# Patient Record
Sex: Male | Born: 1965
Health system: Southern US, Community
[De-identification: ages and names within clinical notes are randomized; demographics above are authoritative.]

## PROBLEM LIST (undated history)

## (undated) DIAGNOSIS — M810 Age-related osteoporosis without current pathological fracture: Secondary | ICD-10-CM

## (undated) DIAGNOSIS — F419 Anxiety disorder, unspecified: Secondary | ICD-10-CM

## (undated) DIAGNOSIS — T7840XA Allergy, unspecified, initial encounter: Secondary | ICD-10-CM

## (undated) DIAGNOSIS — G5603 Carpal tunnel syndrome, bilateral upper limbs: Secondary | ICD-10-CM

## (undated) DIAGNOSIS — R569 Unspecified convulsions: Secondary | ICD-10-CM

## (undated) DIAGNOSIS — M1990 Unspecified osteoarthritis, unspecified site: Secondary | ICD-10-CM

## (undated) DIAGNOSIS — R51 Headache: Secondary | ICD-10-CM

## (undated) DIAGNOSIS — K219 Gastro-esophageal reflux disease without esophagitis: Secondary | ICD-10-CM

## (undated) DIAGNOSIS — I10 Essential (primary) hypertension: Secondary | ICD-10-CM

## (undated) HISTORY — DX: Anxiety disorder, unspecified: F41.9

## (undated) HISTORY — DX: Essential (primary) hypertension: I10

## (undated) HISTORY — PX: ABCESS DRAINAGE: SHX399

## (undated) HISTORY — DX: Unspecified osteoarthritis, unspecified site: M19.90

## (undated) HISTORY — PX: INGUINAL HERNIA REPAIR: SHX194

## (undated) HISTORY — DX: Age-related osteoporosis without current pathological fracture: M81.0

## (undated) HISTORY — DX: Gastro-esophageal reflux disease without esophagitis: K21.9

## (undated) HISTORY — PX: TONSILLECTOMY: SUR1361

## (undated) HISTORY — DX: Allergy, unspecified, initial encounter: T78.40XA

## (undated) HISTORY — DX: Unspecified convulsions: R56.9

## (undated) HISTORY — DX: Headache: R51

## (undated) HISTORY — DX: Carpal tunnel syndrome, bilateral upper limbs: G56.03

---

## 2004-11-05 ENCOUNTER — Emergency Department: Payer: Self-pay | Admitting: Emergency Medicine

## 2004-11-08 ENCOUNTER — Inpatient Hospital Stay: Payer: Self-pay | Admitting: General Practice

## 2005-01-15 ENCOUNTER — Ambulatory Visit: Payer: Self-pay | Admitting: Internal Medicine

## 2005-03-25 ENCOUNTER — Emergency Department: Payer: Self-pay | Admitting: Unknown Physician Specialty

## 2005-06-04 ENCOUNTER — Ambulatory Visit: Payer: Self-pay | Admitting: Internal Medicine

## 2005-11-26 ENCOUNTER — Ambulatory Visit: Payer: Self-pay | Admitting: Internal Medicine

## 2006-03-25 ENCOUNTER — Ambulatory Visit: Payer: Self-pay | Admitting: Internal Medicine

## 2006-04-23 ENCOUNTER — Ambulatory Visit: Payer: Self-pay | Admitting: Internal Medicine

## 2006-07-22 ENCOUNTER — Ambulatory Visit: Payer: Self-pay | Admitting: Internal Medicine

## 2006-10-15 ENCOUNTER — Ambulatory Visit: Payer: Self-pay | Admitting: Internal Medicine

## 2007-02-08 ENCOUNTER — Ambulatory Visit: Payer: Self-pay | Admitting: Family Medicine

## 2007-02-24 ENCOUNTER — Ambulatory Visit: Payer: Self-pay | Admitting: Internal Medicine

## 2007-04-27 ENCOUNTER — Ambulatory Visit: Payer: Self-pay | Admitting: Internal Medicine

## 2007-04-27 LAB — CONVERTED CEMR LAB
Amphetamine Screen, Ur: NEGATIVE
BUN: 8 mg/dL (ref 6–23)
Barbiturate Quant, Ur: NEGATIVE
Benzodiazepines.: POSITIVE — AB
Creatinine, Ser: 0.7 mg/dL (ref 0.4–1.5)
Creatinine,U: 8.9 mg/dL
Direct LDL: 144.5 mg/dL
GFR calc Af Amer: 159 mL/min
GFR calc non Af Amer: 131 mL/min
HDL: 34.6 mg/dL — ABNORMAL LOW (ref >39.0)
Methadone: NEGATIVE
Potassium: 4.2 meq/L (ref 3.5–5.1)
Propoxyphene: NEGATIVE
Total CHOL/HDL Ratio: 6
VLDL: 33 mg/dL (ref 0–40)

## 2007-07-19 ENCOUNTER — Encounter: Payer: Self-pay | Admitting: Internal Medicine

## 2007-07-19 ENCOUNTER — Ambulatory Visit: Payer: Self-pay | Admitting: Internal Medicine

## 2007-07-19 DIAGNOSIS — F419 Anxiety disorder, unspecified: Secondary | ICD-10-CM | POA: Insufficient documentation

## 2007-07-19 DIAGNOSIS — I10 Essential (primary) hypertension: Secondary | ICD-10-CM

## 2007-07-19 DIAGNOSIS — R51 Headache: Secondary | ICD-10-CM

## 2007-07-19 DIAGNOSIS — M1990 Unspecified osteoarthritis, unspecified site: Secondary | ICD-10-CM | POA: Insufficient documentation

## 2007-07-19 DIAGNOSIS — K219 Gastro-esophageal reflux disease without esophagitis: Secondary | ICD-10-CM

## 2007-07-19 DIAGNOSIS — R519 Headache, unspecified: Secondary | ICD-10-CM | POA: Insufficient documentation

## 2007-10-11 ENCOUNTER — Ambulatory Visit: Payer: Self-pay | Admitting: Internal Medicine

## 2007-10-11 ENCOUNTER — Telehealth: Payer: Self-pay | Admitting: Internal Medicine

## 2007-10-11 DIAGNOSIS — J069 Acute upper respiratory infection, unspecified: Secondary | ICD-10-CM | POA: Insufficient documentation

## 2007-10-12 ENCOUNTER — Encounter: Payer: Self-pay | Admitting: Internal Medicine

## 2007-12-03 ENCOUNTER — Telehealth: Payer: Self-pay | Admitting: Internal Medicine

## 2008-01-03 ENCOUNTER — Telehealth (INDEPENDENT_AMBULATORY_CARE_PROVIDER_SITE_OTHER): Payer: Self-pay | Admitting: *Deleted

## 2008-01-24 ENCOUNTER — Telehealth: Payer: Self-pay | Admitting: Internal Medicine

## 2008-02-23 ENCOUNTER — Ambulatory Visit: Payer: Self-pay | Admitting: Internal Medicine

## 2008-02-23 DIAGNOSIS — J309 Allergic rhinitis, unspecified: Secondary | ICD-10-CM | POA: Insufficient documentation

## 2008-02-25 ENCOUNTER — Telehealth: Payer: Self-pay | Admitting: Internal Medicine

## 2008-04-25 ENCOUNTER — Telehealth (INDEPENDENT_AMBULATORY_CARE_PROVIDER_SITE_OTHER): Payer: Self-pay | Admitting: *Deleted

## 2008-04-26 ENCOUNTER — Ambulatory Visit: Payer: Self-pay | Admitting: Internal Medicine

## 2008-04-26 DIAGNOSIS — N3000 Acute cystitis without hematuria: Secondary | ICD-10-CM

## 2008-04-26 LAB — CONVERTED CEMR LAB
Bilirubin Urine: NEGATIVE
Hemoglobin, Urine: NEGATIVE
Leukocytes, UA: NEGATIVE
Nitrite: NEGATIVE
Urobilinogen, UA: 0.2 (ref 0.0–1.0)

## 2008-05-01 ENCOUNTER — Telehealth: Payer: Self-pay | Admitting: Internal Medicine

## 2008-08-17 ENCOUNTER — Ambulatory Visit: Payer: Self-pay | Admitting: Internal Medicine

## 2008-08-20 ENCOUNTER — Encounter: Payer: Self-pay | Admitting: Internal Medicine

## 2008-09-28 ENCOUNTER — Telehealth: Payer: Self-pay | Admitting: Internal Medicine

## 2008-11-13 ENCOUNTER — Telehealth: Payer: Self-pay | Admitting: Internal Medicine

## 2008-11-21 ENCOUNTER — Ambulatory Visit: Payer: Self-pay | Admitting: Internal Medicine

## 2008-11-21 DIAGNOSIS — G8929 Other chronic pain: Secondary | ICD-10-CM

## 2008-11-21 DIAGNOSIS — M549 Dorsalgia, unspecified: Secondary | ICD-10-CM

## 2008-11-22 ENCOUNTER — Telehealth: Payer: Self-pay | Admitting: Internal Medicine

## 2009-01-02 ENCOUNTER — Telehealth: Payer: Self-pay | Admitting: Internal Medicine

## 2009-02-06 ENCOUNTER — Telehealth: Payer: Self-pay | Admitting: Internal Medicine

## 2009-03-23 ENCOUNTER — Emergency Department (HOSPITAL_COMMUNITY): Admission: EM | Admit: 2009-03-23 | Discharge: 2009-03-23 | Payer: Self-pay | Admitting: Emergency Medicine

## 2009-05-01 ENCOUNTER — Ambulatory Visit: Payer: Self-pay | Admitting: Internal Medicine

## 2009-05-22 ENCOUNTER — Ambulatory Visit: Payer: Self-pay | Admitting: Internal Medicine

## 2009-05-22 LAB — CONVERTED CEMR LAB
Barbiturate Quant, Ur: NEGATIVE
Benzodiazepines.: POSITIVE — AB
Methadone: NEGATIVE
Propoxyphene: NEGATIVE

## 2009-05-30 ENCOUNTER — Telehealth: Payer: Self-pay | Admitting: Internal Medicine

## 2009-07-05 ENCOUNTER — Encounter
Admission: RE | Admit: 2009-07-05 | Discharge: 2009-07-05 | Payer: Self-pay | Admitting: Physical Medicine and Rehabilitation

## 2009-07-27 ENCOUNTER — Telehealth: Payer: Self-pay | Admitting: Internal Medicine

## 2009-08-06 ENCOUNTER — Telehealth: Payer: Self-pay | Admitting: Internal Medicine

## 2009-08-09 ENCOUNTER — Encounter: Payer: Self-pay | Admitting: Internal Medicine

## 2009-08-16 ENCOUNTER — Ambulatory Visit: Payer: Self-pay | Admitting: Internal Medicine

## 2009-09-10 ENCOUNTER — Encounter: Payer: Self-pay | Admitting: Internal Medicine

## 2009-09-12 ENCOUNTER — Encounter: Payer: Self-pay | Admitting: Internal Medicine

## 2009-09-28 ENCOUNTER — Telehealth: Payer: Self-pay | Admitting: Internal Medicine

## 2009-09-29 ENCOUNTER — Ambulatory Visit: Payer: Self-pay | Admitting: Family Medicine

## 2009-10-08 ENCOUNTER — Telehealth: Payer: Self-pay | Admitting: Internal Medicine

## 2009-10-09 ENCOUNTER — Ambulatory Visit: Payer: Self-pay | Admitting: Internal Medicine

## 2009-10-23 ENCOUNTER — Encounter: Payer: Self-pay | Admitting: Internal Medicine

## 2009-11-12 ENCOUNTER — Encounter: Payer: Self-pay | Admitting: Internal Medicine

## 2009-11-14 ENCOUNTER — Ambulatory Visit: Payer: Self-pay | Admitting: Internal Medicine

## 2010-01-23 ENCOUNTER — Telehealth: Payer: Self-pay | Admitting: Internal Medicine

## 2010-02-07 ENCOUNTER — Ambulatory Visit: Payer: Self-pay | Admitting: Internal Medicine

## 2010-02-07 ENCOUNTER — Telehealth: Payer: Self-pay | Admitting: Internal Medicine

## 2010-02-07 DIAGNOSIS — R112 Nausea with vomiting, unspecified: Secondary | ICD-10-CM

## 2010-02-07 LAB — CONVERTED CEMR LAB
ALT: 26 units/L (ref 0–53)
AST: 16 units/L (ref 0–37)
Alkaline Phosphatase: 41 units/L (ref 39–117)
BUN: 7 mg/dL (ref 6–23)
Basophils Absolute: 0.1 10*3/uL (ref 0.0–0.1)
Calcium: 9.3 mg/dL (ref 8.4–10.5)
Eosinophils Relative: 1.1 % (ref 0.0–5.0)
GFR calc non Af Amer: 96.68 mL/min (ref >60)
Glucose, Bld: 93 mg/dL (ref 70–99)
HCT: 42.5 % (ref 39.0–52.0)
Hemoglobin: 14.8 g/dL (ref 13.0–17.0)
Lymphocytes Relative: 32.3 % (ref 12.0–46.0)
Lymphs Abs: 2.6 10*3/uL (ref 0.7–4.0)
Monocytes Relative: 9.1 % (ref 3.0–12.0)
Nitrite: NEGATIVE
Platelets: 292 10*3/uL (ref 150.0–400.0)
Potassium: 3.7 meq/L (ref 3.5–5.1)
RDW: 14 % (ref 11.5–14.6)
Sodium: 140 meq/L (ref 135–145)
Specific Gravity, Urine: 1.015 (ref 1.000–1.030)
Total Bilirubin: 0.4 mg/dL (ref 0.3–1.2)
Total Protein, Urine: NEGATIVE mg/dL
Urine Glucose: NEGATIVE mg/dL
WBC: 7.9 10*3/uL (ref 4.5–10.5)
pH: 5.5 (ref 5.0–8.0)

## 2010-02-21 ENCOUNTER — Ambulatory Visit: Payer: Self-pay | Admitting: Internal Medicine

## 2010-05-10 ENCOUNTER — Ambulatory Visit: Payer: Self-pay | Admitting: Internal Medicine

## 2010-06-04 ENCOUNTER — Encounter: Payer: Self-pay | Admitting: Internal Medicine

## 2010-07-04 ENCOUNTER — Telehealth: Payer: Self-pay | Admitting: Internal Medicine

## 2010-07-22 ENCOUNTER — Encounter: Payer: Self-pay | Admitting: Internal Medicine

## 2010-07-31 ENCOUNTER — Telehealth (INDEPENDENT_AMBULATORY_CARE_PROVIDER_SITE_OTHER): Payer: Self-pay | Admitting: *Deleted

## 2010-09-09 ENCOUNTER — Telehealth: Payer: Self-pay | Admitting: Internal Medicine

## 2010-09-18 ENCOUNTER — Encounter: Payer: Self-pay | Admitting: Internal Medicine

## 2010-11-04 ENCOUNTER — Encounter: Payer: Self-pay | Admitting: Internal Medicine

## 2010-11-04 ENCOUNTER — Telehealth: Payer: Self-pay | Admitting: Internal Medicine

## 2010-11-19 ENCOUNTER — Encounter: Payer: Self-pay | Admitting: Internal Medicine

## 2010-11-26 NOTE — Progress Notes (Signed)
  Phone Note Refill Request Message from:  Fax from Pharmacy on January 23, 2010 1:41 PM  Refills Requested: Medication #1:  ALPRAZOLAM 1 MG  TABS three times a day received fax from [facility]   Follow-up for Phone Call        OKI x 3 Follow-up by: Jacques Navy MD,  January 23, 2010 2:33 PM    Prescriptions: ALPRAZOLAM 1 MG  TABS (ALPRAZOLAM) three times a day  #90 x 3   Entered by:   Bill Salinas CMA   Authorized by:   Jacques Navy MD   Signed by:   Bill Salinas CMA on 01/23/2010   Method used:   Telephoned to ...       [facility]* (retail)       [address]       [place], [place]  [address]       Ph: [phone]       Fax: [phone]   RxID:   [id number]

## 2010-11-26 NOTE — Letter (Signed)
Summary: [facility]  [facility]   Imported By: Sherian Rein 11/23/2009 14:03:47  _____________________________________________________________________  External Attachment:    Type:   Image     Comment:   External Document

## 2010-11-26 NOTE — Progress Notes (Signed)
Summary: REFILLS WITHOUT APPOINTMENTS  Phone Note Call from Patient Call back at Home Phone [phone]   Caller: [facility] Summary of Call: MR Jamil WANTS TO ASK IF DR NORINS WOULD SAVE HIM AND MEDICARE A LITTLE BIT OF MONEY BY FILLING HIS PRESCRIPTIONS AND LETTING HIM PICK THEM UP AT THE OFFICE  WITHOUT AN APPOINTMENT.  HE NEEDS HIS OXYCOTIN 30 MG, OXYCODONE 5MG  AND OMEPROZOLE. HE USES THE [facility] Initial call taken by: Hilarie Fredrickson,  July 04, 2010 4:44 PM  Follow-up for Phone Call        Patient is due for Oxy 30mg  on 10/28 and Oxy 5mg  on 11/4.  Follow-up by: Lamar Sprinkles, CMA,  July 04, 2010 5:02 PM  Additional Follow-up for Phone Call Additional follow up Details #1::        Thanks for checking prescription record - due dates noted!  OK for as needed refills on omeprazole. Additional Follow-up by: Jacques Navy MD,  July 04, 2010 5:41 PM    Additional Follow-up for Phone Call Additional follow up Details #2::    Left detailed vm for patient that rx's were not due yet and to call office to schedule office visit at end of october  Follow-up by: Lamar Sprinkles, CMA,  July 09, 2010 2:30 PM  Prescriptions: OMEPRAZOLE 20 MG CPDR (OMEPRAZOLE) 1 once daily  #90 x 3   Entered by:   Lamar Sprinkles, CMA   Authorized by:   Jacques Navy MD   Signed by:   Lamar Sprinkles, CMA on 07/05/2010   Method used:   Electronically to        [facility]* (retail)       [address]       [place], [place]  [address]       Ph: [phone]       Fax: [phone]   RxID:   [id number]

## 2010-11-26 NOTE — Letter (Signed)
Summary: [facility]  [facility]   Imported By: Lester Bruno 06/07/2010 10:40:14  _____________________________________________________________________  External Attachment:    Type:   Image     Comment:   External Document

## 2010-11-26 NOTE — Letter (Signed)
Summary: [facility]   Imported By: Sherian Rein 09/27/2010 08:51:11  _____________________________________________________________________  External Attachment:    Type:   Image     Comment:   External Document

## 2010-11-26 NOTE — Assessment & Plan Note (Signed)
Summary: FU---STC   Vital Signs:  Patient profile:   45 year old male Height:      67 inches Weight:      179 pounds BMI:     28.14 O2 Sat:      98 % on Room air Temp:     98.5 degrees F oral Pulse rate:   80 / minute BP sitting:   148 / 90  (left arm) Cuff size:   regular  Vitals Entered By: Bill Salinas CMA (November 14, 2009 11:09 AM)  O2 Flow:  Room air CC: pt here for follow up on bronchitis. Pt states that the zpak given has helped with his symptoms. Pt also needs to discuss 10mg  oxycodone with doctor/ab   Primary Care Provider:  Norins  CC:  pt here for follow up on bronchitis. Pt states that the zpak given has helped with his symptoms. Pt also needs to discuss 10mg  oxycodone with doctor/ab.  History of Present Illness: Patient seen Dec 4'10 for bronchitis - Rx Z-pak; seen  Dec 14th for follow-up and was getting better. Today he reports that the  bronchitis cleared.  Saw Dr. Ethelene Hal for cervical and lumbar [facility] Dec 28th. Per patient Dr. Ethelene Hal has advised that he can have ESI for spine every 2 months.  He continues to see Dr. Amanda Pea for steroid injetions to hands for relief of arthritic pain and contractures. May come to surgery.   Patient presents today for refill for Rx on Oxycodone  Current Medications (verified): 1)  Alprazolam 1 Mg  Tabs (Alprazolam) .... Three Times A Day 2)  Omeprazole 20 Mg Cpdr (Omeprazole) .Marland Kitchen.. 1 Once Daily 3)  Cetirizine Hcl 10 Mg Tabs (Cetirizine Hcl) .Marland Kitchen.. 1 By Mouth Once Daily 4)  Oxycodone Hcl 10 Mg  Tabs (Oxycodone Hcl) .Marland Kitchen.. 1 Two Times A Day Fill On or After 10/30/2008 5)  Aspirin 325 Mg  Tabs (Aspirin) .... Take 1 Tablet By Mouth Once A Day 6)  Cyclobenzaprine Hcl 10 Mg Tabs (Cyclobenzaprine Hcl) .Marland Kitchen.. 1 By Mouth Three Times A Day For Back Spasm 7)  Meloxicam 15 Mg Tabs (Meloxicam) .Marland Kitchen.. 1 By Mouth Once Daily For Back Pain 8)  Oxycontin 40 Mg Xr12h-Tab (Oxycodone Hcl) .Marland Kitchen.. 1 By Mouth Q 12 Hrs Pls Fill On or After 11-01-2008  Allergies  (verified): 1)  ! Sulfa 2)  Pcn PMH-FH-SH reviewed-no changes except otherwise noted  Review of Systems  The patient denies anorexia, weight loss, weight gain, decreased hearing, hoarseness, chest pain, dyspnea on exertion, headaches, abdominal pain, severe indigestion/heartburn, suspicious skin lesions, difficulty walking, abnormal bleeding, and enlarged lymph nodes.    Physical Exam  General:  alert, well-developed, and well-nourished.   Head:  normocephalic and atraumatic.   Eyes:  pupils equal, pupils round, and corneas and lenses clear.   Neck:  full ROM.   Lungs:  normal respiratory effort.   Heart:  normal rate and regular rhythm.   Msk:  contractures of the 5th digit, with mild contractions of other digits, palmer contractures bilaterally. Nl gait. Neurologic:  alert & oriented X3 and cranial nerves II-XII intact.   Skin:  turgor normal and color normal.   Psych:  Oriented X3, normally interactive, and good eye contact.     Impression & Recommendations:  Problem # 1:  BACK PAIN, CHRONIC (ICD-724.5) Seeing Dr. Ethelene Hal for [facility] which have helped. For pain control he continues on oxycontin 40mg  q12 which he says last about 8 hrs. He has been getting oxycodone 5mg   during national shortage of 10mg  tabs and finds that he has greater flexibility in managing his break-through pain on 5mg  doses. He requests a permanent change to 5mg  doses.  Plan - 30 day supply x 3 for oxycontin and oxycodone.  His updated medication list for this problem includes:    Oxycodone Hcl 5 Mg Tabs (Oxycodone hcl) .Marland Kitchen... 1 every 4 hours as needed for breakthrough #120/month fill on or after 01/28/2010    Aspirin 325 Mg Tabs (Aspirin) .Marland Kitchen... Take 1 tablet by mouth once a day    Cyclobenzaprine Hcl 10 Mg Tabs (Cyclobenzaprine hcl) .Marland Kitchen... 1 by mouth three times a day for back spasm    Meloxicam 15 Mg Tabs (Meloxicam) .Marland Kitchen... 1 by mouth once daily for back pain    Oxycontin 40 Mg Xr12h-tab (Oxycodone hcl) .Marland Kitchen... 1 by  mouth q 12 hrs pls fill on or after 01-30-2010  Problem # 2:  OSTEOARTHRITIS (ICD-715.90) Severe hand deformity. Dr. Amanda Pea has been injecting at the carpal tunnel point with some relief of symptoms.   Plan - continue to follow-up with Dr. Amanda Pea  His updated medication list for this problem includes:    Oxycodone Hcl 5 Mg Tabs (Oxycodone hcl) .Marland Kitchen... 1 every 4 hours as needed for breakthrough #120/month fill on or after 01/28/2010    Aspirin 325 Mg Tabs (Aspirin) .Marland Kitchen... Take 1 tablet by mouth once a day    Meloxicam 15 Mg Tabs (Meloxicam) .Marland Kitchen... 1 by mouth once daily for back pain    Oxycontin 40 Mg Xr12h-tab (Oxycodone hcl) .Marland Kitchen... 1 by mouth q 12 hrs pls fill on or after 01-30-2010  Problem # 3:  HYPERTENSION (ICD-401.9)  BP today: 148/90 Prior BP: 124/70 (10/09/2009)  Patient has been controlled now with slight bump. He is due for follow-up full exam in May and will reassess his blood pressure.   Problem # 4:  GERD (ICD-530.81) Stable on omeprazole.  His updated medication list for this problem includes:    Omeprazole 20 Mg Cpdr (Omeprazole) .Marland Kitchen... 1 once daily  Complete Medication List: 1)  Alprazolam 1 Mg Tabs (Alprazolam) .... Three times a day 2)  Omeprazole 20 Mg Cpdr (Omeprazole) .Marland Kitchen.. 1 once daily 3)  Cetirizine Hcl 10 Mg Tabs (Cetirizine hcl) .Marland Kitchen.. 1 by mouth once daily 4)  Oxycodone Hcl 5 Mg Tabs (Oxycodone hcl) .Marland Kitchen.. 1 every 4 hours as needed for breakthrough #120/month fill on or after 01/28/2010 5)  Aspirin 325 Mg Tabs (Aspirin) .... Take 1 tablet by mouth once a day 6)  Cyclobenzaprine Hcl 10 Mg Tabs (Cyclobenzaprine hcl) .Marland Kitchen.. 1 by mouth three times a day for back spasm 7)  Meloxicam 15 Mg Tabs (Meloxicam) .Marland Kitchen.. 1 by mouth once daily for back pain 8)  Oxycontin 40 Mg Xr12h-tab (Oxycodone hcl) .Marland Kitchen.. 1 by mouth q 12 hrs pls fill on or after 01-30-2010 Prescriptions: OXYCODONE HCL 5 MG TABS (OXYCODONE HCL) 1 every 4 hours as needed for breakthrough #120/month fill on or after  01/28/2010  #120 x 0   Entered by:   Ami Bullins CMA   Authorized by:   Jacques Navy MD   Signed by:   Bill Salinas CMA on 11/14/2009   Method used:   Print then Give to Patient   RxID:   [id number] OXYCODONE HCL 5 MG TABS (OXYCODONE HCL) 1 every 4 hours as needed for breakthrough #120/month fill on or after 12/28/2009  #120 x 0   Entered by:   Ami Bullins CMA   Authorized by:  Jacques Navy MD   Signed by:   Bill Salinas CMA on 11/14/2009   Method used:   Print then Give to Patient   RxID:   [id number] OXYCODONE HCL 5 MG TABS (OXYCODONE HCL) 1 every 4 hours as needed for breakthrough #120/month fill on or after 11/30/2009  #120 x 0   Entered by:   Ami Bullins CMA   Authorized by:   Jacques Navy MD   Signed by:   Bill Salinas CMA on 11/14/2009   Method used:   Print then Give to Patient   RxID:   [id number] OXYCONTIN 40 MG XR12H-TAB (OXYCODONE HCL) 1 by mouth q 12 hrs pls fill on or after 01-30-2010  #60 x 0   Entered by:   Ami Bullins CMA   Authorized by:   Jacques Navy MD   Signed by:   Bill Salinas CMA on 11/14/2009   Method used:   Print then Give to Patient   RxID:   [id number] OXYCONTIN 40 MG XR12H-TAB (OXYCODONE HCL) 1 by mouth q 12 hrs pls fill on or after 12-30-2009  #60 x 0   Entered by:   Ami Bullins CMA   Authorized by:   Jacques Navy MD   Signed by:   Bill Salinas CMA on 11/14/2009   Method used:   Print then Give to Patient   RxID:   [id number] OXYCONTIN 40 MG XR12H-TAB (OXYCODONE HCL) 1 by mouth q 12 hrs pls fill on or after 12-02-2009  #60 x 0   Entered by:   Ami Bullins CMA   Authorized by:   Jacques Navy MD   Signed by:   Bill Salinas CMA on 11/14/2009   Method used:   Print then Give to Patient   RxID:   [id number]

## 2010-11-26 NOTE — Assessment & Plan Note (Signed)
Summary: DR MEN PT--PER KELLY SCHED--HEAD ACHES--VOMIT'G-STC   Vital Signs:  Patient profile:   45 year old male Height:      67 inches Weight:      177.03 pounds BMI:     27.83 O2 Sat:      94 % on Room air Temp:     98.4 degrees F oral Pulse rate:   100 / minute Pulse rhythm:   regular Resp:     16 per minute BP sitting:   118 / 68  (left arm) Cuff size:   large  Vitals Entered By: Rock Nephew CMA (February 07, 2010 9:56 AM)  Nutrition Counseling: Patient's BMI is greater than 25 and therefore counseled on weight management options.  O2 Flow:  Room air CC:  vomiting x 3days Is Patient Diabetic? No Pain Assessment Patient in pain? no        Primary Care Provider:  Norins  CC:   vomiting x 3days.  History of Present Illness: New to me he returns c/o 4 day hx. of nausea, vomiting, and epiagstric pain. In the last 24 hours he has improved and is keeping down food and liquids now.  Preventive Screening-Counseling & Management  Alcohol-Tobacco     Alcohol drinks/day: 0     Smoking Status: current     Smoking Cessation Counseling: yes     Packs/Day: 1  Caffeine-Diet-Exercise     Does Patient Exercise: no  Hep-HIV-STD-Contraception     Hepatitis Risk: no risk noted     HIV Risk: no risk noted     STD Risk: no risk noted      Sexual History:  not active.        Drug Use:  no.        Blood Transfusions:  no.    Medications Prior to Update: 1)  Alprazolam 1 Mg  Tabs (Alprazolam) .... Three Times A Day 2)  Omeprazole 20 Mg Cpdr (Omeprazole) .Marland Kitchen.. 1 Once Daily 3)  Cetirizine Hcl 10 Mg Tabs (Cetirizine Hcl) .Marland Kitchen.. 1 By Mouth Once Daily 4)  Oxycodone Hcl 5 Mg Tabs (Oxycodone Hcl) .Marland Kitchen.. 1 Every 4 Hours As Needed For Breakthrough #120/month Fill On or After 01/28/2010 5)  Aspirin 325 Mg  Tabs (Aspirin) .... Take 1 Tablet By Mouth Once A Day 6)  Cyclobenzaprine Hcl 10 Mg Tabs (Cyclobenzaprine Hcl) .Marland Kitchen.. 1 By Mouth Three Times A Day For Back Spasm 7)  Meloxicam 15 Mg Tabs  (Meloxicam) .Marland Kitchen.. 1 By Mouth Once Daily For Back Pain 8)  Oxycontin 40 Mg Xr12h-Tab (Oxycodone Hcl) .Marland Kitchen.. 1 By Mouth Q 12 Hrs Pls Fill On or After 01-30-2010  Current Medications (verified): 1)  Alprazolam 1 Mg  Tabs (Alprazolam) .... Three Times A Day 2)  Omeprazole 20 Mg Cpdr (Omeprazole) .Marland Kitchen.. 1 Once Daily 3)  Cetirizine Hcl 10 Mg Tabs (Cetirizine Hcl) .Marland Kitchen.. 1 By Mouth Once Daily 4)  Oxycodone Hcl 5 Mg Tabs (Oxycodone Hcl) .Marland Kitchen.. 1 Every 4 Hours As Needed For Breakthrough #120/month Fill On or After 01/28/2010 5)  Aspirin 325 Mg  Tabs (Aspirin) .... Take 1 Tablet By Mouth Once A Day 6)  Cyclobenzaprine Hcl 10 Mg Tabs (Cyclobenzaprine Hcl) .Marland Kitchen.. 1 By Mouth Three Times A Day For Back Spasm 7)  Meloxicam 15 Mg Tabs (Meloxicam) .Marland Kitchen.. 1 By Mouth Once Daily For Back Pain 8)  Oxycontin 40 Mg Xr12h-Tab (Oxycodone Hcl) .Marland Kitchen.. 1 By Mouth Q 12 Hrs Pls Fill On or After 01-30-2010  Allergies (verified): 1)  !  Sulfa 2)  Pcn  Past History:  Past Medical History: Reviewed history from 07/19/2007 and no changes required. UCD Anxiety GERD Headache Hypertension Osteoarthritis chronic pain management  Past Surgical History: Reviewed history from 07/19/2007 and no changes required. Inguinal herniorrhaphy-left Tonsillectomy I&D infected finger  Family History: Reviewed history from 07/19/2007 and no changes required. CAD 2nd degree relative   Social History: Reviewed history from 07/19/2007 and no changes required. 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  The patient denies anorexia, fever, weight loss, chest pain, syncope, dyspnea on exertion, peripheral  edema, prolonged cough, headaches, hemoptysis, melena, hematochezia, severe indigestion/heartburn, hematuria, incontinence, suspicious skin lesions, abnormal bleeding, enlarged lymph nodes, angioedema, and testicular masses.   General:  Denies chills, fatigue, fever, malaise, sweats, and weakness. GI:  Complains of nausea and vomiting; denies abdominal pain, bloody stools, change in bowel habits, constipation, dark tarry stools, diarrhea, excessive appetite, gas, hemorrhoids, indigestion, loss of appetite, vomiting blood, and yellowish skin color. GU:  Denies dysuria, hematuria, incontinence, nocturia, urinary frequency, and urinary hesitancy.  Physical Exam  General:  slurred speech and moves slowly. alert, well-developed, well-nourished, well-hydrated, appropriate dress, cooperative to examination, poor hygiene, and unkempt.   Head:  normocephalic, atraumatic, no abnormalities observed, and no abnormalities palpated.   Eyes:  pink moist mm., no lesions or icterus Mouth:  Oral mucosa and oropharynx without lesions or exudates.  Teeth in good repair. Neck:  No deformities, masses, or tenderness noted. Lungs:  Normal respiratory effort, chest expands symmetrically. Lungs are clear to auscultation, no crackles or wheezes. Heart:  Normal rate and regular rhythm. S1 and S2 normal without gallop, murmur, click, rub or other extra sounds. Abdomen:  abdominal scar in left inguinal area.  soft, non-tender, normal bowel sounds, no distention, no masses, no guarding, no rigidity, no rebound tenderness, no abdominal hernia, no inguinal hernia, no hepatomegaly, no splenomegaly. Rectal:  No external abnormalities noted. Normal sphincter tone. No rectal masses or tenderness. heme negative stool. Genitalia:  circumcised, no hydrocele, no varicocele, no scrotal masses, no cutaneous lesions, no urethral discharge, R testes atrophic, and L testes atrophic.   Prostate:  no gland enlargement, no nodules, no asymmetry,  and no induration.   Msk:  normal ROM, no joint tenderness, no joint swelling, no joint warmth, no redness over joints, no joint deformities, no joint instability, and no crepitation.   Pulses:  R and L carotid,radial,femoral,dorsalis pedis and posterior tibial pulses are full and equal bilaterally Extremities:  No clubbing, cyanosis, edema, or deformity noted with normal full range of motion of all joints.   Neurologic:  No cranial nerve deficits noted. Station and gait are normal. Plantar reflexes are down-going bilaterally. DTRs are symmetrical throughout. Sensory, motor and coordinative functions appear intact. Skin:  turgor normal, color normal, no rashes, no suspicious lesions, no ecchymoses, no petechiae, no purpura, no ulcerations, and no edema.   Cervical Nodes:  No lymphadenopathy noted Axillary Nodes:  No palpable lymphadenopathy Psych:  Cognition and judgment appear intact. Alert and cooperative with normal attention span and concentration. No apparent delusions, illusions, hallucinations   Impression & Recommendations:  Problem # 1:  NAUSEA WITH VOMITING (ICD-787.01) Assessment New  Orders: Venipuncture (59563) TLB-BMP (Basic Metabolic Panel-BMET) (80048-METABOL) TLB-CBC Platelet - w/Differential (85025-CBCD) TLB-Hepatic/Liver Function Pnl (80076-HEPATIC) TLB-TSH (Thyroid Stimulating Hormone) (84443-TSH) TLB-Amylase (82150-AMYL) TLB-Lipase (83690-LIPASE) TLB-Udip w/ Micro (81001-URINE) Venipuncture (87564) T-Acute Abdomen (2 view w/ PA & Chest (33295JO) Hemoccult Guaiac-1 spec.(in office) (82270)  Problem # 2:  GERD (ICD-530.81) Assessment: Unchanged  His updated medication list for this problem includes:    Omeprazole 20 Mg Cpdr (Omeprazole) .Marland Kitchen... 1 once daily  Orders: Venipuncture (84166) TLB-BMP (Basic Metabolic Panel-BMET) (80048-METABOL) TLB-CBC Platelet - w/Differential (85025-CBCD) TLB-Hepatic/Liver Function Pnl (80076-HEPATIC) TLB-TSH (Thyroid Stimulating  Hormone) (84443-TSH) TLB-Amylase (82150-AMYL) TLB-Lipase (83690-LIPASE) TLB-Udip w/ Micro (81001-URINE) Venipuncture (06301) Hemoccult Guaiac-1 spec.(in office) (82270)  Complete Medication List: 1)  Alprazolam 1 Mg Tabs (Alprazolam) .... Three times a day 2)  Omeprazole 20 Mg Cpdr (Omeprazole) .Marland Kitchen.. 1 once daily 3)  Cetirizine Hcl 10 Mg Tabs (Cetirizine hcl) .Marland Kitchen.. 1 by mouth once daily 4)  Oxycodone Hcl 5 Mg Tabs (Oxycodone hcl) .Marland Kitchen.. 1 every 4 hours as needed for breakthrough #120/month fill on or after 01/28/2010 5)  Aspirin 325 Mg Tabs (Aspirin) .... Take 1 tablet by mouth once a day 6)  Cyclobenzaprine Hcl 10 Mg Tabs (Cyclobenzaprine hcl) .Marland Kitchen.. 1 by mouth three times a day for back spasm 7)  Meloxicam 15 Mg Tabs (Meloxicam) .Marland Kitchen.. 1 by mouth once daily for back pain 8)  Oxycontin 40 Mg Xr12h-tab (Oxycodone hcl) .Marland Kitchen.. 1 by mouth q 12 hrs pls fill on or after 01-30-2010  Patient Instructions: 1)  Please schedule a follow-up appointment in 2 weeks. 2)  teh main problem with gastroenteritis is dehydration. Drink plenty of fluids and take solids as you feel better. If you are unable to keep anything down and/or you show signs of dehydration(dry/cracked lips, lack of tears, not urinating, very sleepy), call our office.

## 2010-11-26 NOTE — Letter (Signed)
Summary: [facility]  [facility]   Imported By: Sherian Rein 07/29/2010 13:26:43  _____________________________________________________________________  External Attachment:    Type:   Image     Comment:   External Document

## 2010-11-26 NOTE — Progress Notes (Signed)
Summary: REFILL  Phone Note Refill Request   Refills Requested: Medication #1:  OXYCODONE HCL 5 MG TABS 1 every 4 hours as needed for breakthrough #120/month fill on or after 01/28/2010  Medication #2:  OXYCONTIN 40 MG XR12H-TAB 1 by mouth q 12 hrs pls fill on or after 01-30-2010. Initial call taken by: Lamar Sprinkles, CMA,  February 07, 2010 10:03 AM  Follow-up for Phone Call        OK fo refill with 3 Rxs. Follow-up by: Jacques Navy MD,  February 07, 2010 1:07 PM  Additional Follow-up for Phone Call Additional follow up Details #1::        pt informed and prescriptions put up front for pt to pick up Additional Follow-up by: Ami Bullins CMA,  February 07, 2010 2:23 PM    New/Updated Medications: OXYCODONE HCL 5 MG TABS (OXYCODONE HCL) 1 every 4 hours as needed for breakthrough #120/month fill on or after 02/27/2010 OXYCODONE HCL 5 MG TABS (OXYCODONE HCL) 1 every 4 hours as needed for breakthrough #120/month fill on or after 03/30/2010 OXYCODONE HCL 5 MG TABS (OXYCODONE HCL) 1 every 4 hours as needed for breakthrough #120/month fill on or after 04/29/2010 OXYCONTIN 40 MG XR12H-TAB (OXYCODONE HCL) 1 by mouth q 12 hrs pls fill on or after 03-01-2010 OXYCONTIN 40 MG XR12H-TAB (OXYCODONE HCL) 1 by mouth q 12 hrs pls fill on or after 04-01-2010 OXYCONTIN 40 MG XR12H-TAB (OXYCODONE HCL) 1 by mouth q 12 hrs pls fill on or after 05-01-2010 Prescriptions: OXYCONTIN 40 MG XR12H-TAB (OXYCODONE HCL) 1 by mouth q 12 hrs pls fill on or after 05-01-2010  #60 x 0   Entered by:   Ami Bullins CMA   Authorized by:   Jacques Navy MD   Signed by:   Bill Salinas CMA on 02/07/2010   Method used:   Print then Give to Patient   RxID:   [id number] OXYCONTIN 40 MG XR12H-TAB (OXYCODONE HCL) 1 by mouth q 12 hrs pls fill on or after 04-01-2010  #60 x 0   Entered by:   Ami Bullins CMA   Authorized by:   Jacques Navy MD   Signed by:   Bill Salinas CMA on 02/07/2010   Method used:   Print then Give to  Patient   RxID:   [id number] OXYCONTIN 40 MG XR12H-TAB (OXYCODONE HCL) 1 by mouth q 12 hrs pls fill on or after 03-01-2010  #60 x 0   Entered by:   Ami Bullins CMA   Authorized by:   Jacques Navy MD   Signed by:   Bill Salinas CMA on 02/07/2010   Method used:   Print then Give to Patient   RxID:   [id number] OXYCODONE HCL 5 MG TABS (OXYCODONE HCL) 1 every 4 hours as needed for breakthrough #120/month fill on or after 04/29/2010  #120 x 0   Entered by:   Ami Bullins CMA   Authorized by:   Jacques Navy MD   Signed by:   Bill Salinas CMA on 02/07/2010   Method used:   Print then Give to Patient   RxID:   [id number] OXYCODONE HCL 5 MG TABS (OXYCODONE HCL) 1 every 4 hours as needed for breakthrough #120/month fill on or after 03/30/2010  #120 x 0   Entered by:   Ami Bullins CMA   Authorized by:   Jacques Navy MD   Signed by:   Bill Salinas CMA on 02/07/2010   Method used:  Print then Give to Patient   RxID:   [id number] OXYCODONE HCL 5 MG TABS (OXYCODONE HCL) 1 every 4 hours as needed for breakthrough #120/month fill on or after 02/27/2010  #120 x 0   Entered by:   Ami Bullins CMA   Authorized by:   Jacques Navy MD   Signed by:   Bill Salinas CMA on 02/07/2010   Method used:   Print then Give to Patient   RxID:   [id number]

## 2010-11-26 NOTE — Progress Notes (Signed)
Summary: REFILLs  Phone Note Call from Patient   Summary of Call: Pt's last office visit was 04/2010, He wants to know if he can get his refills without an office visit. Due for Oxy 30 on 10/28 and Oxy 5mg  on 11/4.  Initial call taken by: Lamar Sprinkles, CMA,  July 31, 2010 10:19 AM  Follow-up for Phone Call        He has been very stable. OK to do refills for next 3 mnths without OV Follow-up by: Jacques Navy MD,  July 31, 2010 11:01 AM  Additional Follow-up for Phone Call Additional follow up Details #1::        prescriptions printed out and pt called. No answer lmoam for pt to call back. Prescriptions up front for pick up Additional Follow-up by: Ami Bullins CMA,  July 31, 2010 1:00 PM    New/Updated Medications: OXYCODONE HCL 5 MG TABS (OXYCODONE HCL) 1 every 4 hours as needed for breakthrough #120/month fill on or after 08/30/2010 OXYCODONE HCL 5 MG TABS (OXYCODONE HCL) 1 every 4 hours as needed for breakthrough #120/month fill on or after 09/29/2010 OXYCODONE HCL 5 MG TABS (OXYCODONE HCL) 1 every 4 hours as needed for breakthrough #120/month fill on or after 10/30/2010 OXYCONTIN 30 MG XR12H-TAB (OXYCODONE HCL) 1 by mouth three times a day. fill on or after 08/23/2010 OXYCONTIN 30 MG XR12H-TAB (OXYCODONE HCL) 1 by mouth three times a day. fill on or after 09/23/2010 OXYCONTIN 30 MG XR12H-TAB (OXYCODONE HCL) 1 by mouth three times a day. fill on or after 10/23/2010 Prescriptions: OXYCONTIN 30 MG XR12H-TAB (OXYCODONE HCL) 1 by mouth three times a day. fill on or after 10/23/2010  #90 x 0   Entered by:   Ami Bullins CMA   Authorized by:   Jacques Navy MD   Signed by:   Bill Salinas CMA on 07/31/2010   Method used:   Print then Give to Patient   RxID:   [id number] OXYCONTIN 30 MG XR12H-TAB (OXYCODONE HCL) 1 by mouth three times a day. fill on or after 09/23/2010  #90 x 0   Entered by:   Ami Bullins CMA   Authorized by:   Jacques Navy MD   Signed by:   Bill Salinas CMA on 07/31/2010   Method used:   Print then Give to Patient   RxID:   [id number] OXYCONTIN 30 MG XR12H-TAB (OXYCODONE HCL) 1 by mouth three times a day. fill on or after 08/23/2010  #90 x 0   Entered by:   Ami Bullins CMA   Authorized by:   Jacques Navy MD   Signed by:   Bill Salinas CMA on 07/31/2010   Method used:   Print then Give to Patient   RxID:   [id number] OXYCODONE HCL 5 MG TABS (OXYCODONE HCL) 1 every 4 hours as needed for breakthrough #120/month fill on or after 10/30/2010  #120 x 0   Entered by:   Ami Bullins CMA   Authorized by:   Jacques Navy MD   Signed by:   Bill Salinas CMA on 07/31/2010   Method used:   Print then Give to Patient   RxID:   [id number] OXYCODONE HCL 5 MG TABS (OXYCODONE HCL) 1 every 4 hours as needed for breakthrough #120/month fill on or after 09/29/2010  #120 x 0   Entered by:   Ami Bullins CMA   Authorized by:   Jacques Navy MD   Signed  by:   Ami Bullins CMA on 07/31/2010   Method used:   Print then Give to Patient   RxID:   [id number] OXYCODONE HCL 5 MG TABS (OXYCODONE HCL) 1 every 4 hours as needed for breakthrough #120/month fill on or after 08/30/2010  #120 x 0   Entered by:   Ami Bullins CMA   Authorized by:   Jacques Navy MD   Signed by:   Bill Salinas CMA on 07/31/2010   Method used:   Print then Give to Patient   RxID:   [id number]

## 2010-11-26 NOTE — Progress Notes (Signed)
Summary: REFILL  Phone Note Refill Request Message from:  Pharmacy  Refills Requested: Medication #1:  ALPRAZOLAM 1 MG  TABS three times a day [place] [facility]  Initial call taken by: Lamar Sprinkles, CMA,  September 09, 2010 12:09 PM  Follow-up for Phone Call        ok to refill x 5 Follow-up by: Jacques Navy MD,  September 09, 2010 1:32 PM    Prescriptions: ALPRAZOLAM 1 MG  TABS (ALPRAZOLAM) three times a day  #100 x 5   Entered by:   Ami Bullins CMA   Authorized by:   Jacques Navy MD   Signed by:   Bill Salinas CMA on 09/09/2010   Method used:   Telephoned to ...       [facility]* (retail)       [address]       [place], [place]  [address]       Ph: [phone]       Fax: [phone]   RxID:   [id number]

## 2010-11-26 NOTE — Op Note (Signed)
Summary: Lumbar epidural inj/[facility]  Lumbar epidural inj/[facility]   Imported By: Lester [place] 11/05/2009 09:52:34  _____________________________________________________________________  External Attachment:    Type:   Image     Comment:   External Document
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
## 2010-11-28 NOTE — Letter (Signed)
Summary: [facility]   Imported By: Sherian Rein 11/12/2010 09:23:10  _____________________________________________________________________  External Attachment:    Type:   Image     Comment:   External Document

## 2010-11-28 NOTE — Progress Notes (Signed)
Summary: RFs  Phone Note Call from Patient Call back at Home Phone [phone]   Caller: walk in form---[phone] Call For: Phillip Navy MD Summary of Call: Pt requesting refills of oxycontin. Pt had appt with Dr Ethelene Hal today for eval.Pt will be seeing Dr Clarisse Gouge to eval neck and headaches. Pt will sched ov with you if needed.  Initial call taken by: Verdell Face,  November 04, 2010 4:26 PM  Follow-up for Phone Call        ok to do refills: 30 day supply with 3 rx Follow-up by: Phillip Navy MD,  November 05, 2010 6:00 PM  Additional Follow-up for Phone Call Additional follow up Details #1::        Pending signature Additional Follow-up by: Lamar Sprinkles, CMA,  November 06, 2010 5:34 PM    Additional Follow-up for Phone Call Additional follow up Details #2::    Rx's ready, left detailed vm on pt's hm # that rx's are up front for pick up Follow-up by: Lamar Sprinkles, CMA,  November 07, 2010 11:32 AM  New/Updated Medications: OXYCONTIN 30 MG XR12H-TAB (OXYCODONE HCL) 1 by mouth three times a day. fill on or after 11/23/2010 OXYCONTIN 30 MG XR12H-TAB (OXYCODONE HCL) 1 by mouth three times a day. fill on or after 12/24/2010 OXYCONTIN 30 MG XR12H-TAB (OXYCODONE HCL) 1 by mouth three times a day. fill on or after 01/22/2011 OXYCODONE HCL 5 MG TABS (OXYCODONE HCL) 1 every 4 hours as needed for breakthrough #120/month fill on or after 11/30/2010 OXYCODONE HCL 5 MG TABS (OXYCODONE HCL) 1 every 4 hours as needed for breakthrough #120/month fill on or after 12/29/2010 Prescriptions: OXYCODONE HCL 5 MG TABS (OXYCODONE HCL) 1 every 4 hours as needed for breakthrough #120/month fill on or after 12/29/2010  #120 x 0   Entered by:   Lamar Sprinkles, CMA   Authorized by:   Phillip Navy MD   Signed by:   Lamar Sprinkles, CMA on 11/06/2010   Method used:   Print then Give to Patient   RxID:   [id number] OXYCONTIN 30 MG XR12H-TAB (OXYCODONE HCL) 1 by mouth three times a day. fill on  or after 01/22/2011  #90 x 0   Entered by:   Lamar Sprinkles, CMA   Authorized by:   Phillip Navy MD   Signed by:   Lamar Sprinkles, CMA on 11/06/2010   Method used:   Print then Give to Patient   RxID:   [id number] OXYCODONE HCL 5 MG TABS (OXYCODONE HCL) 1 every 4 hours as needed for breakthrough #120/month fill on or after 11/30/2010  #120 x 0   Entered by:   Lamar Sprinkles, CMA   Authorized by:   Phillip Navy MD   Signed by:   Lamar Sprinkles, CMA on 11/06/2010   Method used:   Print then Give to Patient   RxID:   [id number] OXYCONTIN 30 MG XR12H-TAB (OXYCODONE HCL) 1 by mouth three times a day. fill on or after 12/24/2010  #90 x 0   Entered by:   Lamar Sprinkles, CMA   Authorized by:   Phillip Navy MD   Signed by:   Lamar Sprinkles, CMA on 11/06/2010   Method used:   Print then Give to Patient   RxID:   [id number] OXYCONTIN 30 MG XR12H-TAB (OXYCODONE HCL) 1 by mouth three times a day. fill on or after 11/23/2010  #90 x 0   Entered by:   Lamar Sprinkles, CMA  Authorized by:   Phillip Navy MD   Signed by:   Lamar Sprinkles, CMA on 11/06/2010   Method used:   Print then Give to Patient   RxID:   [id number]

## 2010-11-29 ENCOUNTER — Other Ambulatory Visit: Payer: Self-pay | Admitting: Neurology

## 2010-12-04 ENCOUNTER — Other Ambulatory Visit: Payer: Self-pay | Admitting: Neurology

## 2010-12-04 DIAGNOSIS — G44009 Cluster headache syndrome, unspecified, not intractable: Secondary | ICD-10-CM

## 2010-12-10 ENCOUNTER — Other Ambulatory Visit: Payer: Self-pay

## 2010-12-12 NOTE — Letter (Signed)
Summary: Rene Kocher MD  Rene Kocher MD   Imported By: Lester Baring 12/05/2010 09:25:11  _____________________________________________________________________  External Attachment:    Type:   Image     Comment:   External Document

## 2011-01-29 ENCOUNTER — Telehealth: Payer: Self-pay | Admitting: *Deleted

## 2011-01-29 NOTE — Telephone Encounter (Signed)
Ok to refill "breakthrough" prescription

## 2011-01-29 NOTE — Telephone Encounter (Signed)
Patient requesting refill of his "breakthru" pain medication. Per our records, he is due. Ok for fill?

## 2011-01-30 MED ORDER — OXYCODONE HCL 5 MG PO TABS: 5.0000 mg | ORAL_TABLET | ORAL | Status: DC | PRN

## 2011-01-30 NOTE — Telephone Encounter (Signed)
Pt aware, rx ready. 

## 2011-02-10 ENCOUNTER — Encounter: Payer: Self-pay | Admitting: Internal Medicine

## 2011-02-10 ENCOUNTER — Ambulatory Visit (INDEPENDENT_AMBULATORY_CARE_PROVIDER_SITE_OTHER): Payer: Medicare Other | Admitting: Internal Medicine

## 2011-02-10 DIAGNOSIS — F411 Generalized anxiety disorder: Secondary | ICD-10-CM

## 2011-02-10 DIAGNOSIS — I10 Essential (primary) hypertension: Secondary | ICD-10-CM

## 2011-02-10 DIAGNOSIS — M1990 Unspecified osteoarthritis, unspecified site: Secondary | ICD-10-CM

## 2011-02-10 MED ORDER — OXYCODONE HCL 30 MG PO TB12: 30.0000 mg | ORAL_TABLET | Freq: Three times a day (TID) | ORAL | Status: DC

## 2011-02-10 MED ORDER — OXYCODONE HCL 5 MG PO TABS: 5.0000 mg | ORAL_TABLET | ORAL | Status: DC | PRN

## 2011-02-10 MED ORDER — ALPRAZOLAM 1 MG PO TABS: 1.0000 mg | ORAL_TABLET | Freq: Three times a day (TID) | ORAL | Status: DC | PRN

## 2011-02-10 MED ORDER — OMEPRAZOLE 20 MG PO CPDR: 20.0000 mg | DELAYED_RELEASE_CAPSULE | Freq: Every day | ORAL | Status: DC

## 2011-02-10 NOTE — Progress Notes (Signed)
  Subjective:    Patient ID: Phillip Mullen, male    DOB: 1965/12/27, 45 y.o.   MRN: [id number]  HPI  Phillip Mullen presents for medication refill for his chronic pain related to back problems and RA. He has been doing ok. He has had injections, ESI, by Dr. Ethelene Hal. He would like to get more active but he is not a candidate for more pain medications for the purpose of allowing him to play golf. He has had no other medical problems  PMH, FamHx and SocHx reviewed for any changes and relevance.    Review of Systems Review of Systems  Constitutional:  Negative for fever, chills, activity change and unexpected weight change.  HENT:  Negative for hearing loss, ear pain, congestion, neck stiffness and postnasal drip.   Eyes: Negative for pain, discharge and visual disturbance.  Respiratory: Negative for chest tightness and wheezing.   Cardiovascular: Negative for chest pain and palpitations.       [No decreased exercise tolerance Gastrointestinal: [No change in bowel habit. No bloating or gas. No reflux or indigestion Genitourinary: Negative for urgency, frequency, flank pain and difficulty urinating.  Musculoskeletal:  Positive for multiple joints pain especially hands, positive for back pain. Neurological: Negative for dizziness, tremors, weakness and headaches.  Hematological: Negative for adenopathy.  Psychiatric/Behavioral: Negative for behavioral problems and dysphoric mood.       Objective:   Physical Exam  Constitutional: He is oriented to person, place, and time. He appears well-developed and well-nourished. No distress.  HENT:  Head: Normocephalic and atraumatic.  Right Ear: External ear normal.  Left Ear: External ear normal.  Mouth/Throat: Oropharynx is clear and moist.  Eyes: Conjunctivae and EOM are normal. Left eye exhibits no discharge.  Neck: Neck supple.  Cardiovascular: Normal rate and regular rhythm.   Pulmonary/Chest: Effort normal and breath sounds normal. No  respiratory distress.  Abdominal: Soft. Bowel sounds are normal. There is no tenderness.  Musculoskeletal:       Degenerative changes DIP, PIP joints both hands, enlargement of both wrists.   Neurological: He is alert and oriented to person, place, and time. He has normal reflexes. No cranial nerve deficit. Coordination normal.  Skin: Skin is warm and dry.  Psychiatric: He has a normal mood and affect. His behavior is normal.          Assessment & Plan:  1. OA - stable with some improved motion. Pain seems adequately controlled. Actvities are significantly limited by arthritic change, i.e. Ability to hold or swing a golf club.  Plan - continue to see ortho consultant as needed           Continue present pain regimen - Rx provided for 3 months.  2. Hypertension - very well controlled.  3. Anxiety - renewed alprazolam Rx.

## 2011-04-24 ENCOUNTER — Other Ambulatory Visit: Payer: Self-pay | Admitting: *Deleted

## 2011-04-24 MED ORDER — CETIRIZINE HCL 10 MG PO TABS: 10.0000 mg | ORAL_TABLET | Freq: Every day | ORAL | Status: DC

## 2011-05-02 ENCOUNTER — Telehealth: Payer: Self-pay | Admitting: *Deleted

## 2011-05-02 NOTE — Telephone Encounter (Signed)
Patient requesting Rfs of Oxy5 mg and 30 mg. He also left note that he wants to see Dr Phillip Mullen asap for about getting a "shot" for his back. (Pt would like rfs w/o OV because he says he is having a problem with his insurance.)

## 2011-05-04 NOTE — Telephone Encounter (Signed)
Ok for refills - 30 day 3 scripts

## 2011-05-05 MED ORDER — OXYCODONE HCL 30 MG PO TB12: 30.0000 mg | ORAL_TABLET | Freq: Three times a day (TID) | ORAL | Status: DC

## 2011-05-05 MED ORDER — OXYCODONE HCL 5 MG PO TABS: 5.0000 mg | ORAL_TABLET | ORAL | Status: DC | PRN

## 2011-05-05 NOTE — Telephone Encounter (Signed)
Pending signature

## 2011-05-06 NOTE — Telephone Encounter (Signed)
Left message for pt to let him know his prescriptions are available. Rxs up front in cabinet

## 2011-08-11 ENCOUNTER — Ambulatory Visit (INDEPENDENT_AMBULATORY_CARE_PROVIDER_SITE_OTHER): Payer: Medicare Other | Admitting: Internal Medicine

## 2011-08-11 DIAGNOSIS — J069 Acute upper respiratory infection, unspecified: Secondary | ICD-10-CM

## 2011-08-11 DIAGNOSIS — M549 Dorsalgia, unspecified: Secondary | ICD-10-CM

## 2011-08-11 MED ORDER — OXYCODONE HCL 30 MG PO TB12: 30.0000 mg | ORAL_TABLET | Freq: Three times a day (TID) | ORAL | Status: DC

## 2011-08-11 MED ORDER — DOXYCYCLINE HYCLATE 100 MG PO TABS: 100.0000 mg | ORAL_TABLET | Freq: Two times a day (BID) | ORAL | Status: AC

## 2011-08-11 MED ORDER — OXYCODONE HCL 5 MG PO TABS: 5.0000 mg | ORAL_TABLET | ORAL | Status: DC | PRN

## 2011-08-11 MED ORDER — ALPRAZOLAM 1 MG PO TABS: 1.0000 mg | ORAL_TABLET | Freq: Three times a day (TID) | ORAL | Status: DC | PRN

## 2011-08-11 NOTE — Patient Instructions (Signed)
Chronic pain - renewed your prescriptions for 3 months.  Carpal tunnel - Dr. Amanda Pea is an excellent hand surgeon and can be helpful.  Upper respiratory infection - tender sinuses and congestion. Plan - doxycycline 100 mg twice a day for 7 days; use dymista 1 spray to each nostril once a day for congestion; robitussin DM or the generic equivalent for cough and mucus.

## 2011-08-11 NOTE — Progress Notes (Signed)
  Subjective:    Patient ID: Phillip Mullen, male    DOB: [birth year], 45 y.o.   MRN: [id number]  HPI Phillip Mullen presents for refill on is chronic pain medications. He reports that he is having increased pain in the left wrist. In addition, he has continued back and hand pain. He will be returning to Dr. Ethelene Hal for follow -up and to Dr. Amanda Pea to discuss carpal tunner release.  He has URI with sinus congestion and cough. No fever.   I have reviewed the patient's medical history in detail and updated the computerized patient record.   Review of Systems System review is negative for any constitutional, cardiac, pulmonary, GI or neuro symptoms or complaints other than as described in the HPI.     Objective:   Physical Exam Vitals reviewed - stable Gen'l- WNWD white man in no distress HEENT- C&S clear. Mild sinus tenderness to percussion Chest - clear to A&P, no wheeze or rales Cor - RRR Ext - deformity of several digits; no erythema or synovial thickening noted.        Assessment & Plan:

## 2011-08-12 NOTE — Assessment & Plan Note (Signed)
Symptoms c/w bacterial URI.   Plan - doxycycline for 7 days           dymista 1 spray to each nostril daily

## 2011-08-12 NOTE — Assessment & Plan Note (Signed)
Doing OK. No somnolence, no GI problems. He does continue to see Dr. Ethelene Hal for injection therapy.  Plan - renewed routine medications.

## 2011-10-22 ENCOUNTER — Other Ambulatory Visit (INDEPENDENT_AMBULATORY_CARE_PROVIDER_SITE_OTHER): Payer: Medicare Other

## 2011-10-22 ENCOUNTER — Ambulatory Visit (INDEPENDENT_AMBULATORY_CARE_PROVIDER_SITE_OTHER): Payer: Medicare Other | Admitting: Internal Medicine

## 2011-10-22 VITALS — BP 130/80 | HR 77 | Temp 97.4°F | Wt 174.0 lb

## 2011-10-22 DIAGNOSIS — I10 Essential (primary) hypertension: Secondary | ICD-10-CM

## 2011-10-22 DIAGNOSIS — R109 Unspecified abdominal pain: Secondary | ICD-10-CM

## 2011-10-22 DIAGNOSIS — M549 Dorsalgia, unspecified: Secondary | ICD-10-CM

## 2011-10-22 LAB — COMPREHENSIVE METABOLIC PANEL
ALT: 30 U/L (ref 0–53)
AST: 20 U/L (ref 0–37)
Albumin: 4.8 g/dL (ref 3.5–5.2)
BUN: 17 mg/dL (ref 6–23)
CO2: 28 mEq/L (ref 19–32)
Calcium: 9.5 mg/dL (ref 8.4–10.5)
Chloride: 103 mEq/L (ref 96–112)
GFR: 114.9 mL/min (ref >60.00)
Potassium: 4 mEq/L (ref 3.5–5.1)

## 2011-10-22 LAB — URINALYSIS, ROUTINE W REFLEX MICROSCOPIC
Hgb urine dipstick: NEGATIVE
Ketones, ur: NEGATIVE
Urine Glucose: NEGATIVE
Urobilinogen, UA: 0.2 (ref 0.0–1.0)

## 2011-10-22 MED ORDER — OXYCODONE HCL 5 MG PO TABS: 5.0000 mg | ORAL_TABLET | ORAL | Status: DC | PRN

## 2011-10-22 MED ORDER — OXYCODONE HCL 30 MG PO TB12: 30.0000 mg | ORAL_TABLET | Freq: Three times a day (TID) | ORAL | Status: DC

## 2011-10-22 MED ORDER — DOXYCYCLINE HYCLATE 100 MG PO TABS: 100.0000 mg | ORAL_TABLET | Freq: Two times a day (BID) | ORAL | Status: AC

## 2011-10-22 NOTE — Progress Notes (Signed)
  Subjective:    Patient ID: Phillip Mullen, male    DOB: [birth year], 45 y.o.   MRN: [id number]  HPI [name] presents for follow-up of chronic pain and renewal of Rx. He was doing some plumbing work under the house in a crawl space. The next day he developed severe right sided flank pain and had dark yellow green urine. He did take a few doses of doxycycline which he says did help.  He is loosing his medicaid coverage as of January 1. Oxycontin costs $600+ a month. He needs an alternative - looking at the costs his best bet will be methadone.   I have reviewed the patient's medical history in detail and updated the computerized patient record.    Review of Systems System review is negative for any constitutional, cardiac, pulmonary, GI or neuro symptoms or complaints other than as described in the HPI.     Objective:   Physical Exam Vitals reviewed - no fever Gen'l- WNWD white man in some discomfort with periodic spasms of right flank HEENT- C&S clear, PERRLA Chest- good breath sounds Cor- RRR MSK - tender at right flank to percussion and to palpation.  Lab Results  Component Value Date   WBC 7.9 02/07/2010   HGB 14.8 02/07/2010   HCT 42.5 02/07/2010   PLT 292.0 02/07/2010   GLUCOSE 81 10/22/2011   CHOL 208* 04/27/2007   TRIG 163* 04/27/2007   HDL 34.6* 04/27/2007   LDLDIRECT 144.5 04/27/2007   ALT 30 10/22/2011   AST 20 10/22/2011   NA 139 10/22/2011   K 4.0 10/22/2011   CL 103 10/22/2011   CREATININE 0.8 10/22/2011   BUN 17 10/22/2011   CO2 28 10/22/2011   TSH 1.40 02/07/2010        Urinalysis - negative for infection or blood       Assessment & Plan:  Low back strain - no evidence of infection or kidney stone. Suspect he pulled a muscle while working in the crawl space.  Plan - heat, lineament

## 2011-10-23 NOTE — Assessment & Plan Note (Signed)
BP Readings from Last 3 Encounters:  10/22/11 130/80  08/11/11 136/82  02/10/11 118/78   OK control

## 2011-10-23 NOTE — Assessment & Plan Note (Signed)
Provided routine renewal Rx for three months. However, if he cannot get his medicaid reinstated he will need to change to an affordable regimen. At this point methadone will be most cost effective.  Conversion: oxycontin 30mg  tid to methadone 40 mg q6 Over 3 days reduce oxycontin 1/3 per day and initiate methadone 10 mg q6:

## 2011-10-24 ENCOUNTER — Telehealth: Payer: Self-pay | Admitting: *Deleted

## 2011-10-24 NOTE — Telephone Encounter (Signed)
Pt informed

## 2011-10-24 NOTE — Telephone Encounter (Signed)
Message copied by [facility], Chrisotpher Rivero B on Fri Oct 24, 2011  4:24 PM ------      Message from: Illene Regulus E      Created: Thu Oct 23, 2011  8:08 AM       Please call - U/A and metabolic were normal. For flank pain - heat and lineament of choice

## 2012-01-29 ENCOUNTER — Ambulatory Visit (INDEPENDENT_AMBULATORY_CARE_PROVIDER_SITE_OTHER): Payer: Medicare Other | Admitting: Internal Medicine

## 2012-01-29 ENCOUNTER — Encounter: Payer: Self-pay | Admitting: Internal Medicine

## 2012-01-29 VITALS — BP 120/80 | HR 70 | Temp 97.1°F | Resp 18 | Wt 170.2 lb

## 2012-01-29 DIAGNOSIS — M719 Bursopathy, unspecified: Secondary | ICD-10-CM | POA: Diagnosis not present

## 2012-01-29 DIAGNOSIS — M67919 Unspecified disorder of synovium and tendon, unspecified shoulder: Secondary | ICD-10-CM | POA: Diagnosis not present

## 2012-01-29 DIAGNOSIS — M549 Dorsalgia, unspecified: Secondary | ICD-10-CM | POA: Diagnosis not present

## 2012-01-29 DIAGNOSIS — M7552 Bursitis of left shoulder: Secondary | ICD-10-CM

## 2012-01-29 MED ORDER — ALPRAZOLAM 1 MG PO TABS: 1.0000 mg | ORAL_TABLET | Freq: Three times a day (TID) | ORAL | Status: DC | PRN

## 2012-01-29 MED ORDER — MELOXICAM 15 MG PO TABS: 15.0000 mg | ORAL_TABLET | Freq: Every day | ORAL | Status: DC

## 2012-01-29 MED ORDER — METHYLPREDNISOLONE ACETATE 40 MG/ML IJ SUSP
40.0000 mg | Freq: Once | INTRAMUSCULAR | Status: AC
  Administered 2012-01-29: 40 mg via INTRAMUSCULAR

## 2012-01-29 MED ORDER — OXYCODONE HCL 5 MG PO TABS: 5.0000 mg | ORAL_TABLET | ORAL | Status: DC | PRN

## 2012-01-29 MED ORDER — OXYCODONE HCL 30 MG PO TB12: 30.0000 mg | ORAL_TABLET | Freq: Three times a day (TID) | ORAL | Status: DC

## 2012-01-29 MED ORDER — OMEPRAZOLE 20 MG PO CPDR: 20.0000 mg | DELAYED_RELEASE_CAPSULE | Freq: Every day | ORAL | Status: DC

## 2012-01-29 MED ORDER — CYCLOBENZAPRINE HCL 10 MG PO TABS: 10.0000 mg | ORAL_TABLET | Freq: Three times a day (TID) | ORAL | Status: DC | PRN

## 2012-01-29 MED ORDER — OXYCODONE HCL 40 MG PO TB12: 40.0000 mg | ORAL_TABLET | Freq: Three times a day (TID) | ORAL | Status: DC

## 2012-02-01 NOTE — Progress Notes (Signed)
  Subjective:    Patient ID: Phillip Mullen, male    DOB: 1966/07/29, 46 y.o.   MRN: [id number]  HPI Mr. Markarian presents complaining of uncontrolled and progressive pain. He has been taking oxycontin 30 mg tid and has been using 25-30 mg oxycodone daily in addition. He owes  a balance to Dr Ethelene Hal and cannot be seen for further treatment of his arthritic and orthopedic problems until this is settled. He does have DDD/DJD back and he does have significant arthritis of his hands. However, his pain seems disproportionate to his diagnosis. Last MRI imaging lumbar spine and cervical spine in 2010.  Past Medical History  Diagnosis Date  . Anxiety   . GERD (gastroesophageal reflux disease)   . Headache   . Hypertension   . Osteoarthritis    Past Surgical History  Procedure Date  . Inguinal hernia repair   . Tonsillectomy   . Abcess drainage     Infected finger   No family history on file. History   Social History  . Marital Status: Single    Spouse Name: N/A    Number of Children: N/A  . Years of Education: N/A   Occupational History  . Not on file.   Social History Main Topics  . Smoking status: Current Everyday Smoker -- 1.0 packs/day  . Smokeless tobacco: Not on file  . Alcohol Use: Not on file  . Drug Use: Not on file  . Sexually Active: Not on file   Other Topics Concern  . Not on file   Social History Narrative   1 year of collegeHVAC until disabledLong term monogamous relationshipCurrent smokerAlcohol use- noDrug use- noRegular exercise- no       Review of Systems System review is negative for any constitutional, cardiac, pulmonary, GI or neuro symptoms or complaints other than as described in the HPI.     Objective:   Physical Exam Filed Vitals:   01/29/12 1627  BP: 120/80  Pulse: 70  Temp: 97.1 F (36.2 C)  Resp: 18   Gen'l- chronically ill appearing white man who is not in acute distress. He is wearing a loose fitting back brace. He is alert  and awake. Cor - RRR Pulm - normal respirations MSK- no obvious joint inflammation, no synovial thickening, no hot joints. Normal gait.       Assessment & Plan:

## 2012-02-01 NOTE — Assessment & Plan Note (Signed)
Discussed his pain management and the high dose of medication he is taking with baseline oxycontin 30 mg tid (90 mg) and breakthrough oxycodone (30 mg) for 120 mg daily of oxycodone.  Plan - will change to oxycontin 40 mg tid and continue oxycodone 5 mg for breakthrough with close follow up. He is given only a 1 month supply of oxycontin.           At next OV quantitative drug screen.

## 2012-02-26 DIAGNOSIS — M542 Cervicalgia: Secondary | ICD-10-CM | POA: Diagnosis not present

## 2012-02-26 DIAGNOSIS — M6281 Muscle weakness (generalized): Secondary | ICD-10-CM | POA: Diagnosis not present

## 2012-02-26 DIAGNOSIS — M25519 Pain in unspecified shoulder: Secondary | ICD-10-CM | POA: Diagnosis not present

## 2012-02-26 DIAGNOSIS — IMO0002 Reserved for concepts with insufficient information to code with codable children: Secondary | ICD-10-CM | POA: Diagnosis not present

## 2012-03-04 DIAGNOSIS — M542 Cervicalgia: Secondary | ICD-10-CM | POA: Diagnosis not present

## 2012-03-04 DIAGNOSIS — M6281 Muscle weakness (generalized): Secondary | ICD-10-CM | POA: Diagnosis not present

## 2012-03-04 DIAGNOSIS — M25519 Pain in unspecified shoulder: Secondary | ICD-10-CM | POA: Diagnosis not present

## 2012-03-04 DIAGNOSIS — M25619 Stiffness of unspecified shoulder, not elsewhere classified: Secondary | ICD-10-CM | POA: Diagnosis not present

## 2012-03-09 DIAGNOSIS — M25619 Stiffness of unspecified shoulder, not elsewhere classified: Secondary | ICD-10-CM | POA: Diagnosis not present

## 2012-03-09 DIAGNOSIS — M6281 Muscle weakness (generalized): Secondary | ICD-10-CM | POA: Diagnosis not present

## 2012-03-09 DIAGNOSIS — M542 Cervicalgia: Secondary | ICD-10-CM | POA: Diagnosis not present

## 2012-03-09 DIAGNOSIS — M25519 Pain in unspecified shoulder: Secondary | ICD-10-CM | POA: Diagnosis not present

## 2012-03-16 ENCOUNTER — Telehealth: Payer: Self-pay | Admitting: Internal Medicine

## 2012-03-16 NOTE — Telephone Encounter (Signed)
PT IS REQUESTING REFILL ON OXYCOTIN 40

## 2012-03-17 MED ORDER — OXYCODONE HCL 40 MG PO TB12: 40.0000 mg | ORAL_TABLET | Freq: Three times a day (TID) | ORAL | Status: DC

## 2012-03-17 NOTE — Telephone Encounter (Signed)
Patient of Dr. Debby Bud is reqeusting refill on oxycotin 40. Please advise.

## 2012-03-17 NOTE — Telephone Encounter (Signed)
[place] controlled subst registry reviewed - ok

## 2012-03-17 NOTE — Telephone Encounter (Signed)
Called pt no answer [facility] rx ready for pick-up. Place in cabinet up front... 03/17/12@9 :06am/LMB

## 2012-04-07 ENCOUNTER — Encounter: Payer: Self-pay | Admitting: Internal Medicine

## 2012-04-07 ENCOUNTER — Ambulatory Visit (INDEPENDENT_AMBULATORY_CARE_PROVIDER_SITE_OTHER): Payer: Medicare Other | Admitting: Internal Medicine

## 2012-04-07 VITALS — BP 150/90 | HR 100 | Temp 98.1°F | Resp 16 | Ht 67.0 in | Wt 177.0 lb

## 2012-04-07 DIAGNOSIS — M549 Dorsalgia, unspecified: Secondary | ICD-10-CM | POA: Diagnosis not present

## 2012-04-07 DIAGNOSIS — M25512 Pain in left shoulder: Secondary | ICD-10-CM

## 2012-04-07 DIAGNOSIS — M25519 Pain in unspecified shoulder: Secondary | ICD-10-CM | POA: Diagnosis not present

## 2012-04-07 MED ORDER — OXYCODONE HCL 5 MG PO TABS: 5.0000 mg | ORAL_TABLET | ORAL | Status: DC | PRN

## 2012-04-07 MED ORDER — OXYCODONE HCL 40 MG PO TB12: 40.0000 mg | ORAL_TABLET | Freq: Three times a day (TID) | ORAL | Status: DC

## 2012-04-09 ENCOUNTER — Telehealth: Payer: Self-pay | Admitting: Internal Medicine

## 2012-04-09 DIAGNOSIS — M25512 Pain in left shoulder: Secondary | ICD-10-CM | POA: Insufficient documentation

## 2012-04-09 NOTE — Progress Notes (Signed)
  Subjective:    Patient ID: Phillip Mullen, male    DOB: 07-Jan-1966, 46 y.o.   MRN: [id number]  HPI [name] presents for follow up of chronic pain. He has been having more trouble with his left shoulder. He is 5 wks s/p steroid injection. He reports decreased ROM along with pain. He does admit to a click but he has not had immobilization. He did see a PA at [facility] - ortho. Had repeat injection. Point of interest: he states he has paid off his back bill at [facility].  He has been using E-cigarette and has cut way back on cigarettes in his effort to stop smoking.   Past Medical History  Diagnosis Date  . Anxiety   . GERD (gastroesophageal reflux disease)   . Headache   . Hypertension   . Osteoarthritis    Past Surgical History  Procedure Date  . Inguinal hernia repair   . Tonsillectomy   . Abcess drainage     Infected finger   No family history on file. History   Social History  . Marital Status: Single    Spouse Name: N/A    Number of Children: N/A  . Years of Education: N/A   Occupational History  . Not on file.   Social History Main Topics  . Smoking status: Current Everyday Smoker -- 1.0 packs/day  . Smokeless tobacco: Not on file  . Alcohol Use: Not on file  . Drug Use: Not on file  . Sexually Active: Not on file   Other Topics Concern  . Not on file   Social History Narrative   1 year of collegeHVAC until disabledLong term monogamous relationshipCurrent smokerAlcohol use- noDrug use- noRegular exercise- no       Review of Systems System review is negative for any constitutional, cardiac, pulmonary, GI or neuro symptoms or complaints other than as described in the HPI.     Objective:   Physical Exam Filed Vitals:   04/07/12 1039  BP: 150/90  Pulse: 100  Temp: 98.1 F (36.7 C)  Resp: 16   General - wnwd white man in no distress HEENT- droop lids, C&S clear, PERRLA Cor - 2+ radial, RRR Pulm - normal respirations MSK -  decreased active ROM left shoulder due to pain.       Assessment & Plan:

## 2012-04-09 NOTE — Assessment & Plan Note (Signed)
Continued shoulder pain and decreased ROM. Not eligible for repeat steroid injetion. He needs evaluation for internal derangement  Plan - he wishes to return to [place] [facility]

## 2012-04-09 NOTE — Telephone Encounter (Signed)
Forward to Dr. Debby Bud for review. 04-09-12 ym

## 2012-04-09 NOTE — Assessment & Plan Note (Addendum)
Kept current with chronic pain med Rx's. He is adhering to single prescriber rule.  Plan - he wishes to return to Dr. Ethelene Hal for consideration of ESI

## 2012-04-24 DIAGNOSIS — M5137 Other intervertebral disc degeneration, lumbosacral region: Secondary | ICD-10-CM | POA: Diagnosis not present

## 2012-05-06 DIAGNOSIS — M25519 Pain in unspecified shoulder: Secondary | ICD-10-CM | POA: Diagnosis not present

## 2012-05-26 ENCOUNTER — Other Ambulatory Visit: Payer: Self-pay

## 2012-05-26 MED ORDER — CETIRIZINE HCL 10 MG PO TABS: 10.0000 mg | ORAL_TABLET | Freq: Every day | ORAL | Status: DC

## 2012-07-07 ENCOUNTER — Other Ambulatory Visit: Payer: Medicare Other

## 2012-07-07 ENCOUNTER — Ambulatory Visit (INDEPENDENT_AMBULATORY_CARE_PROVIDER_SITE_OTHER): Payer: Medicare Other | Admitting: Internal Medicine

## 2012-07-07 DIAGNOSIS — K219 Gastro-esophageal reflux disease without esophagitis: Secondary | ICD-10-CM

## 2012-07-07 DIAGNOSIS — M549 Dorsalgia, unspecified: Secondary | ICD-10-CM | POA: Diagnosis not present

## 2012-07-07 DIAGNOSIS — I10 Essential (primary) hypertension: Secondary | ICD-10-CM

## 2012-07-07 MED ORDER — OXYCODONE HCL 5 MG PO TABS: 5.0000 mg | ORAL_TABLET | ORAL | Status: DC | PRN

## 2012-07-07 MED ORDER — OXYCODONE HCL 40 MG PO TB12: 40.0000 mg | ORAL_TABLET | Freq: Three times a day (TID) | ORAL | Status: DC

## 2012-07-07 MED ORDER — ALPRAZOLAM 1 MG PO TABS: 1.0000 mg | ORAL_TABLET | Freq: Three times a day (TID) | ORAL | Status: DC | PRN

## 2012-07-07 NOTE — Progress Notes (Signed)
Subjective:     Patient ID: Phillip Mullen, male   DOB: July 01, 1966, 46 y.o.   MRN: [id number]  HPI Comments: Phillip Mullen is a 46 yo male with a history of long term tobacco use, chronic back pain, and shoulder pain who has come to clinic today for medication refills and abdominal pain. His abdominal pain began began approximately 6 months ago and has been steady since. The pain is located in his epigastric region and described as a stabbing pain that varies between a 5 to 7 out of 10. The pain gets better with Tums and after eating a meal. He has been on aspirin and meloxicam for over a year now. He is also on omeprazole. He reports that he has difficulty swallowing and occasionally aspirates liquids.  He also wanted his Roxicodone, Alprazolam, and OxyContin refilled. He reports he is adhering to a single prescriber for his medications. He agrees to undergo drug testing today.  I have counseled Phillip Mullen on smoking cessation. He wants to quit. He has been having a hard time quitting. He states that he has been using an e-cigarette to help quit smoking.  Past Medical History  Diagnosis Date  . Anxiety   . GERD (gastroesophageal reflux disease)   . Headache   . Hypertension   . Osteoarthritis    Past Surgical History  Procedure Date  . Inguinal hernia repair   . Tonsillectomy   . Abcess drainage     Infected finger   No family history on file. History   Social History  . Marital Status: Single    Spouse Name: N/A    Number of Children: N/A  . Years of Education: N/A   Occupational History  . Not on file.   Social History Main Topics  . Smoking status: Current Every Day Smoker -- 1.0 packs/day  . Smokeless tobacco: Not on file  . Alcohol Use: Not on file  . Drug Use: Not on file  . Sexually Active: Not on file   Other Topics Concern  . Not on file   Social History Narrative   1 year of collegeHVAC until disabledLong term monogamous relationshipCurrent  smokerAlcohol use- noDrug use- noRegular exercise- no    Current Outpatient Prescriptions on File Prior to Visit  Medication Sig Dispense Refill  . aspirin 81 MG tablet Take 81 mg by mouth daily.        . cetirizine (ZYRTEC) 10 MG tablet Take 1 tablet (10 mg total) by mouth daily.  30 tablet  12  . cyclobenzaprine (FLEXERIL) 10 MG tablet Take 1 tablet (10 mg total) by mouth 3 (three) times daily as needed.  90 tablet  5  . magnesium hydroxide (MILK OF MAGNESIA) 800 MG/5ML suspension Take 15 mLs by mouth daily as needed.        . meloxicam (MOBIC) 15 MG tablet Take 1 tablet (15 mg total) by mouth daily.  30 tablet  5  . omeprazole (PRILOSEC) 20 MG capsule Take 1 capsule (20 mg total) by mouth daily.  30 capsule  5  . DISCONTD: ALPRAZolam (XANAX) 1 MG tablet Take 1 tablet (1 mg total) by mouth 3 (three) times daily as needed.  90 tablet  5  . DISCONTD: oxyCODONE (OXY IR/ROXICODONE) 5 MG immediate release tablet Take 1 tablet (5 mg total) by mouth every 4 (four) hours as needed. Fill on or afte08/16/2013.  120 tablet  0  . DISCONTD: oxyCODONE (OXYCONTIN) 40 MG 12 hr tablet Take 1 tablet (40  mg total) by mouth 3 (three) times daily. Fill on or after 06/07/2012  90 tablet  0  . DISCONTD: oxyCODONE (OXYCONTIN) 40 MG 12 hr tablet Take 1 tablet (40 mg total) by mouth 3 (three) times daily. Fill on or after 04/07/2012  90 tablet  0     Review of Systems  All other systems reviewed and are negative.       Objective:   Physical Exam  Nursing note and vitals reviewed. Constitutional: He is oriented to person, place, and time. No distress.       Hoarse voice.  Neck: Neck supple. No thyromegaly present.  Pulmonary/Chest: Effort normal. No respiratory distress. He has rales. He exhibits no tenderness.  Abdominal: Soft. Bowel sounds are normal. He exhibits no distension and no mass. There is no tenderness (no tenderness to deep or light palpation). There is no rebound and no guarding.       Negative  murphy sign.   Musculoskeletal: Normal range of motion.       Tenderness in palpation to left shoulder. His left shoulder did not appear swollen and had full ROM.   Neurological: He is alert and oriented to person, place, and time.  Skin: He is not diaphoretic.  Psychiatric: He has a normal mood and affect. His behavior is normal. Judgment and thought content normal.       Passed his mini-cog. Able to remember three words and draw an accurate clock with stated time.   There were no vitals filed for this visit.      Assessment & Plan:     1. Epigastric Pain - Given the characteristic of this patient's abdominal pain with his medication regimen in the setting of mild dysphagia with aspiration and rales in lungs the DDx includes esophageal neoplasm, esophageal stricture, peptic ulcer, and gastritis. Gall stones unlikely given location of pain with negative murphy sign.  PLAN - Increase Omeprazole and refer to GI for endoscopy to further evaluate his symptoms. 2. Medication refill - Patient is not impaired by his medications. He has adhered to a single prescriber and agrees to undergo tox screen.  PLAN - Tox screen and refill patient's pain medication.    Attending note: patient interviewed and examined. Agree with the assessment and plan by Phillip Mullen, MSIII except will hold off on referral to GI - if he doesn't respond to full therapeutic dose of omeprazole will refer.

## 2012-07-07 NOTE — Patient Instructions (Addendum)
We will refer you to GI for an endoscopy to evaluate your difficulty swallowing and stomach pain. In the mean time we will increase your dose of Prilosec to 40 mg.  We have drug tested you today and refilled your medications. The results of the drug test will be mailed to you.  Your handicap form has been filled out.  I have attached three articles on smoking and smoking cessation to this summary for you to read at your leisure to follow up on our discussion today. If you decide you want further help with smoking cessation please contact the clinic. We would be happy to help you.   Esophagogastroduodenoscopy This is an endoscopic procedure (a procedure that uses a device like a flexible telescope) that allows your caregiver to view the upper stomach and small bowel. This test allows your caregiver to look at the esophagus. The esophagus carries food from your mouth to your stomach. They can also look at your duodenum. This is the first part of the small intestine that attaches to the stomach. This test is used to detect problems in the bowel such as ulcers and inflammation. PREPARATION FOR TEST Nothing to eat after midnight the day before the test. NORMAL FINDINGS Normal esophagus, stomach, and duodenum. Ranges for normal findings may vary among different laboratories and hospitals. You should always check with your doctor after having lab work or other tests done to discuss the meaning of your test results and whether your values are considered within normal limits. MEANING OF TEST   Your caregiver will go over the test results with you and discuss the importance and meaning of your results, as well as treatment options and the need for additional tests if necessary. OBTAINING THE TEST RESULTS It is your responsibility to obtain your test results. Ask the lab or department performing the test when and how you will get your results. Document Released: 02/13/2005 Document Revised: 10/02/2011  Document Reviewed: 09/22/2008 [facility] Patient Information 2012 [place], [place].  Smoking Cessation, Tips for Success YOU CAN QUIT SMOKING If you are ready to quit smoking, congratulations! You have chosen to help yourself be healthier. Cigarettes bring nicotine, tar, carbon monoxide, and other irritants into your body. Your lungs, heart, and blood vessels will be able to work better without these poisons. There are many different ways to quit smoking. Nicotine gum, nicotine patches, a nicotine inhaler, or nicotine nasal spray can help with physical craving. Hypnosis, support groups, and medicines help break the habit of smoking. Here are some tips to help you quit for good.  Throw away all cigarettes.   Clean and remove all ashtrays from your home, work, and car.   On a card, write down your reasons for quitting. Carry the card with you and read it when you get the urge to smoke.   Cleanse your body of nicotine. Drink enough water and fluids to keep your urine clear or pale yellow. Do this after quitting to flush the nicotine from your body.   Learn to predict your moods. Do not let a bad situation be your excuse to have a cigarette. Some situations in your life might tempt you into wanting a cigarette.   Never have "just one" cigarette. It leads to wanting another and another. Remind yourself of your decision to quit.   Change habits associated with smoking. If you smoked while driving or when feeling stressed, try other activities to replace smoking. Stand up when drinking your coffee. Brush your teeth after eating.  Sit in a different chair when you read the paper. Avoid alcohol while trying to quit, and try to drink fewer caffeinated beverages. Alcohol and caffeine may urge you to smoke.   Avoid foods and drinks that can trigger a desire to smoke, such as sugary or spicy foods and alcohol.   Ask people who smoke not to smoke around you.   Have something planned to do right after eating  or having a cup of coffee. Take a walk or exercise to perk you up. This will help to keep you from overeating.   Try a relaxation exercise to calm you down and decrease your stress. Remember, you may be tense and nervous for the first 2 weeks after you quit, but this will pass.   Find new activities to keep your hands busy. Play with a pen, coin, or rubber band. Doodle or draw things on paper.   Brush your teeth right after eating. This will help cut down on the craving for the taste of tobacco after meals. You can try mouthwash, too.   Use oral substitutes, such as lemon drops, carrots, a cinnamon stick, or chewing gum, in place of cigarettes. Keep them handy so they are available when you have the urge to smoke.   When you have the urge to smoke, try deep breathing.   Designate your home as a nonsmoking area.   If you are a heavy smoker, ask your caregiver about a prescription for nicotine chewing gum. It can ease your withdrawal from nicotine.   Reward yourself. Set aside the cigarette money you save and buy yourself something nice.   Look for support from others. Join a support group or smoking cessation program. Ask someone at home or at work to help you with your plan to quit smoking.   Always ask yourself, "Do I need this cigarette or is this just a reflex?" Tell yourself, "Today, I choose not to smoke," or "I do not want to smoke." You are reminding yourself of your decision to quit, even if you do smoke a cigarette.  HOW WILL I FEEL WHEN I QUIT SMOKING?  The benefits of not smoking start within days of quitting.   You may have symptoms of withdrawal because your body is used to nicotine (the addictive substance in cigarettes). You may crave cigarettes, be irritable, feel very hungry, cough often, get headaches, or have difficulty concentrating.   The withdrawal symptoms are only temporary. They are strongest when you first quit but will go away within 10 to 14 days.   When  withdrawal symptoms occur, stay in control. Think about your reasons for quitting. Remind yourself that these are signs that your body is healing and getting used to being without cigarettes.   Remember that withdrawal symptoms are easier to treat than the major diseases that smoking can cause.   Even after the withdrawal is over, expect periodic urges to smoke. However, these cravings are generally short-lived and will go away whether you smoke or not. Do not smoke!   If you relapse and smoke again, do not lose hope. Most smokers quit 3 times before they are successful.   If you relapse, do not give up! Plan ahead and think about what you will do the next time you get the urge to smoke.  LIFE AS A NONSMOKER: MAKE IT FOR A MONTH, MAKE IT FOR LIFE Day 1: Hang this page where you will see it every day. Day 2: Get rid of all  ashtrays, matches, and lighters. Day 3: Drink water. Breathe deeply between sips. Day 4: Avoid places with smoke-filled air, such as bars, clubs, or the smoking section of restaurants. Day 5: Keep track of how much money you save by not smoking. Day 6: Avoid boredom. Keep a good book with you or go to the movies. Day 7: Reward yourself! One week without smoking! Day 8: Make a dental appointment to get your teeth cleaned. Day 9: Decide how you will turn down a cigarette before it is offered to you. Day 10: Review your reasons for quitting. Day 11: Distract yourself. Stay active to keep your mind off smoking and to relieve tension. Take a walk, exercise, read a book, do a crossword puzzle, or try a new hobby. Day 12: Exercise. Get off the bus before your stop or use stairs instead of escalators. Day 13: Call on friends for support and encouragement. Day 14: Reward yourself! Two weeks without smoking! Day 15: Practice deep breathing exercises. Day 16: Bet a friend that you can stay a nonsmoker. Day 17: Ask to sit in nonsmoking sections of restaurants. Day 18: Hang up "No  Smoking" signs. Day 19: Think of yourself as a nonsmoker. Day 20: Each morning, tell yourself you will not smoke. Day 21: Reward yourself! Three weeks without smoking! Day 22: Think of smoking in negative ways. Remember how it stains your teeth, gives you bad breath, and leaves you short of breath. Day 23: Eat a nutritious breakfast. Day 24:Do not relive your days as a smoker. Day 25: Hold a pencil in your hand when talking on the telephone. Day 26: Tell all your friends you do not smoke. Day 27: Think about how much better food tastes. Day 28: Remember, one cigarette is one too many. Day 29: Take up a hobby that will keep your hands busy. Day 30: Congratulations! One month without smoking! Give yourself a big reward. Your caregiver can direct you to community resources or hospitals for support, which may include:  Group support.   Education.   Hypnosis.   Subliminal therapy.  Document Released: 07/11/2004 Document Revised: 10/02/2011 Document Reviewed: 07/30/2009 [facility] Patient Information 2012 [place], [place].Smoking Cessation This document explains the best ways for you to quit smoking and new treatments to help. It lists new medicines that can double or triple your chances of quitting and quitting for good. It also considers ways to avoid relapses and concerns you may have about quitting, including weight gain. NICOTINE: A POWERFUL ADDICTION If you have tried to quit smoking, you know how hard it can be. It is hard because nicotine is a very addictive drug. For some people, it can be as addictive as heroin or cocaine. Usually, people make 2 or 3 tries, or more, before finally being able to quit. Each time you try to quit, you can learn about what helps and what hurts. Quitting takes hard work and a lot of effort, but you can quit smoking. QUITTING SMOKING IS ONE OF THE MOST IMPORTANT THINGS YOU WILL EVER DO.  You will live longer, feel better, and live better.   The impact on  your body of quitting smoking is felt almost immediately:   Within 20 minutes, blood pressure decreases. Pulse returns to its normal level.   After 8 hours, carbon monoxide levels in the blood return to normal. Oxygen level increases.   After 24 hours, chance of heart attack starts to decrease. Breath, hair, and body stop smelling like smoke.   After  48 hours, damaged nerve endings begin to recover. Sense of taste and smell improve.   After 72 hours, the body is virtually free of nicotine. Bronchial tubes relax and breathing becomes easier.   After 2 to 12 weeks, lungs can hold more air. Exercise becomes easier and circulation improves.   Quitting will reduce your risk of having a heart attack, stroke, cancer, or lung disease:   After 1 year, the risk of coronary heart disease is cut in half.   After 5 years, the risk of stroke falls to the same as a nonsmoker.   After 10 years, the risk of lung cancer is cut in half and the risk of other cancers decreases significantly.   After 15 years, the risk of coronary heart disease drops, usually to the level of a nonsmoker.   If you are pregnant, quitting smoking will improve your chances of having a healthy baby.   The people you live with, especially your children, will be healthier.   You will have extra money to spend on things other than cigarettes.  FIVE KEYS TO QUITTING Studies have shown that these 5 steps will help you quit smoking and quit for good. You have the best chances of quitting if you use them together: 1. Get ready.  2. Get support and encouragement.  3. Learn new skills and behaviors.  4. Get medicine to reduce your nicotine addiction and use it correctly.  5. Be prepared for relapse or difficult situations. Be determined to continue trying to quit, even if you do not succeed at first.  1. GET READY  Set a quit date.   Change your environment.   Get rid of ALL cigarettes, ashtrays, matches, and lighters in your  home, car, and place of work.   Do not let people smoke in your home.   Review your past attempts to quit. Think about what worked and what did not.   Once you quit, do not smoke. NOT EVEN A PUFF!  2. GET SUPPORT AND ENCOURAGEMENT Studies have shown that you have a better chance of being successful if you have help. You can get support in many ways.  Tell your family, friends, and coworkers that you are going to quit and need their support. Ask them not to smoke around you.   Talk to your caregivers (doctor, dentist, nurse, pharmacist, psychologist, and/or smoking counselor).   Get individual, group, or telephone counseling and support. The more counseling you have, the better your chances are of quitting. Programs are available at [employer] and health centers. Call your local health department for information about programs in your area.   Spiritual beliefs and practices may help some smokers quit.   Quit meters are Photographer that keep track of quit statistics, such as amount of "quit-time," cigarettes not smoked, and money saved.   Many smokers find one or more of the many self-help books available useful in helping them quit and stay off tobacco.  3. LEARN NEW SKILLS AND BEHAVIORS  Try to distract yourself from urges to smoke. Talk to someone, go for a walk, or occupy your time with a task.   When you first try to quit, change your routine. Take a different route to work. Drink tea instead of coffee. Eat breakfast in a different place.   Do something to reduce your stress. Take a hot bath, exercise, or read a book.   Plan something enjoyable to do every day. Reward yourself  for not smoking.   Explore interactive web-based programs that specialize in helping you quit.  4. GET MEDICINE AND USE IT CORRECTLY Medicines can help you stop smoking and decrease the urge to smoke. Combining medicine with the above behavioral methods and support can  quadruple your chances of successfully quitting smoking. The [place]. Food and Drug Administration (FDA) has approved 7 medicines to help you quit smoking. These medicines fall into 3 categories.  Nicotine replacement therapy (delivers nicotine to your body without the negative effects and risks of smoking):   Nicotine gum: Available over-the-counter.   Nicotine lozenges: Available over-the-counter.   Nicotine inhaler: Available by prescription.   Nicotine nasal spray: Available by prescription.   Nicotine skin patches (transdermal): Available by prescription and over-the-counter.   Antidepressant medicine (helps people abstain from smoking, but how this works is unknown):   Bupropion sustained-release (SR) tablets: Available by prescription.   Nicotinic receptor partial agonist (simulates the effect of nicotine in your brain):   Varenicline tartrate tablets: Available by prescription.   Ask your caregiver for advice about which medicines to use and how to use them. Carefully read the information on the package.   Everyone who is trying to quit may benefit from using a medicine. If you are pregnant or trying to become pregnant, nursing an infant, you are under age 86, or you smoke fewer than 10 cigarettes per day, talk to your caregiver before taking any nicotine replacement medicines.   You should stop using a nicotine replacement product and call your caregiver if you experience nausea, dizziness, weakness, vomiting, fast or irregular heartbeat, mouth problems with the lozenge or gum, or redness or swelling of the skin around the patch that does not go away.   Do not use any other product containing nicotine while using a nicotine replacement product.   Talk to your caregiver before using these products if you have diabetes, heart disease, asthma, stomach ulcers, you had a recent heart attack, you have high blood pressure that is not controlled with medicine, a history of irregular  heartbeat, or you have been prescribed medicine to help you quit smoking.  5. BE PREPARED FOR RELAPSE OR DIFFICULT SITUATIONS  Most relapses occur within the first 3 months after quitting. Do not be discouraged if you start smoking again. Remember, most people try several times before they finally quit.   You may have symptoms of withdrawal because your body is used to nicotine. You may crave cigarettes, be irritable, feel very hungry, cough often, get headaches, or have difficulty concentrating.   The withdrawal symptoms are only temporary. They are strongest when you first quit, but they will go away within 10 to 14 days.  Here are some difficult situations to watch for:  Alcohol. Avoid drinking alcohol. Drinking lowers your chances of successfully quitting.   Caffeine. Try to reduce the amount of caffeine you consume. It also lowers your chances of successfully quitting.   Other smokers. Being around smoking can make you want to smoke. Avoid smokers.   Weight gain. Many smokers will gain weight when they quit, usually less than 10 pounds. Eat a healthy diet and stay active. Do not let weight gain distract you from your main goal, quitting smoking. Some medicines that help you quit smoking may also help delay weight gain. You can always lose the weight gained after you quit.   Bad mood or depression. There are a lot of ways to improve your mood other than smoking.  If you  are having problems with any of these situations, talk to your caregiver. SPECIAL SITUATIONS AND CONDITIONS Studies suggest that everyone can quit smoking. Your situation or condition can give you a special reason to quit.  Pregnant women/new mothers: By quitting, you protect your baby's health and your own.   Hospitalized patients: By quitting, you reduce health problems and help healing.   Heart attack patients: By quitting, you reduce your risk of a second heart attack.   Lung, head, and neck cancer patients: By  quitting, you reduce your chance of a second cancer.   Parents of children and adolescents: By quitting, you protect your children from illnesses caused by secondhand smoke.  QUESTIONS TO THINK ABOUT Think about the following questions before you try to stop smoking. You may want to talk about your answers with your caregiver.  Why do you want to quit?   If you tried to quit in the past, what helped and what did not?   What will be the most difficult situations for you after you quit? How will you plan to handle them?   Who can help you through the tough times? Your family? Friends? Caregiver?   What pleasures do you get from smoking? What ways can you still get pleasure if you quit?  Here are some questions to ask your caregiver:  How can you help me to be successful at quitting?   What medicine do you think would be best for me and how should I take it?   What should I do if I need more help?   What is smoking withdrawal like? How can I get information on withdrawal?  Quitting takes hard work and a lot of effort, but you can quit smoking. FOR MORE INFORMATION   Smokefree.gov (http://www.davis-sullivan.com/) provides free, accurate, evidence-based information and professional assistance to help support the immediate and long-term needs of people trying to quit smoking. Document Released: 10/07/2001 Document Revised: 10/02/2011 Document Reviewed: 07/30/2009 [facility] Patient Information 2012 [place], [place].Smoking Hazards Smoking cigarettes is extremely bad for your health. Tobacco smoke has over 200 known poisons in it. There are over 60 chemicals in tobacco smoke that cause cancer. Some of the chemicals found in cigarette smoke include:    Cyanide.   Benzene.   Formaldehyde.   Methanol (wood alcohol).   Acetylene (fuel used in welding torches).   Ammonia.  Cigarette smoke also contains the poisonous gases nitrogen oxide and carbon monoxide.   Cigarette smokers have an increased  risk of many serious medical problems, including:  Lung cancer.   Lung disease (such as pneumonia, bronchitis, and emphysema).   Heart attack and chest pain due to the heart not getting enough oxygen (angina).   Heart disease and peripheral blood vessel disease.   Hypertension.   Stroke.   Oral cancer (cancer of the lip, mouth, or voice box).   Bladder cancer.   Pancreatic cancer.   Cervical cancer.   Pregnancy complications, including premature birth.   Low birthweight babies.   Early menopause.   Lower estrogen level for women.   Infertility.   Facial wrinkles.   Blindness.   Increased risk of broken bones (fractures).   Senile dementia.   Stillbirths and smaller newborn babies, birth defects, and genetic damage to sperm.   Stomach ulcers and internal bleeding.  Children of smokers have an increased risk of the following, because of secondhand smoke exposure:    Sudden infant death syndrome (SIDS).   Respiratory infections.   Lung cancer.  Heart disease.   Ear infections.  Smoking causes approximately:  90% of all lung cancer deaths in men.   80% of all lung cancer deaths in women.   90% of deaths from chronic obstructive lung disease.  Compared with nonsmokers, smoking increases the risk of:  Coronary heart disease by 2 to 4 times.   Stroke by 2 to 4 times.   Men developing lung cancer by 23 times.   Women developing lung cancer by 13 times.   Dying from chronic obstructive lung diseases by 12 times.  Someone who smokes 2 packs a day loses about 8 years of his or her life. Even smoking lightly shortens your life expectancy by several years. You can greatly reduce the risk of medical problems for you and your family by stopping now. Smoking is the most preventable cause of death and disease in our society. Within days of quitting smoking, your circulation returns to normal, you decrease the risk of having a heart attack, and your lung  capacity improves. There may be some increased phlegm in the first few days after quitting, and it may take months for your lungs to clear up completely. Quitting for 10 years cuts your lung cancer risk to almost that of a nonsmoker. WHY IS SMOKING ADDICTIVE?  Nicotine is the chemical agent in tobacco that is capable of causing addiction or dependence.   When you smoke and inhale, nicotine is absorbed rapidly into the bloodstream through your lungs. Nicotine absorbed through the lungs is capable of creating a powerful addiction. Both inhaled and non-inhaled nicotine may be addictive.   Addiction studies of cigarettes and spit tobacco show that addiction to nicotine occurs mainly during the teen years, when young people begin using tobacco products.  WHAT ARE THE BENEFITS OF QUITTING?   There are many health benefits to quitting smoking.    Likelihood of developing cancer and heart disease decreases. Health improvements are seen almost immediately.   Blood pressure, pulse rate, and breathing patterns start returning to normal soon after quitting.   People who quit may see an improvement in their overall quality of life.  Some people choose to quit all at once. Other options include nicotine replacement products, such as patches, gum, and nasal sprays. Do not use these products without first checking with your caregiver. QUITTING SMOKING It is not easy to quit smoking. Nicotine is addicting, and longtime habits are hard to change. To start, you can write down all your reasons for quitting, tell your family and friends you want to quit, and ask for their help. Throw your cigarettes away, chew gum or cinnamon sticks, keep your hands busy, and drink extra water or juice. Go for walks and practice deep breathing to relax. Think of all the money you are saving: around $1,000 a year, for the average pack-a-day smoker. Nicotine patches and gum have been shown to improve success at efforts to stop smoking.  Zyban (bupropion) is an anti-depressant drug that can be prescribed to reduce nicotine withdrawal symptoms and to suppress the urge to smoke. Smoking is an addiction with both physical and psychological effects. Joining a stop-smoking support group can help you cope with the emotional issues. For more information and advice on programs to stop smoking, call your doctor, your local hospital, or these organizations:  American Lung Association - 1-800-LUNGUSA   American Cancer Society - 1-800-ACS-2345  Document Released: 11/20/2004 Document Revised: 10/02/2011 Document Reviewed: 07/25/2009 [facility] Patient Information 2012 [place], [place].

## 2012-07-07 NOTE — Assessment & Plan Note (Addendum)
Patient is reporting epigastric pain today with mild dysphagia and aspiration. His pain improves with meals and tums. The pain is a sharp pain that varies between a 5 to a 7 on a scale of 10. This pain began 6 months ago. Abdominal exam was benign and showed no pain on deep or light palpation, negative murphy sign, normal sized liver with no splenomegaly.  PLAN - we will increase omeprazole dose and refer to GI for endoscopy to evaluate his symptoms if he doesn't respond to full dose omeprazole.  Smoking cessation - reduce risk from causative factor.

## 2012-07-08 LAB — DRUG SCREEN, URINE
Amphetamine Screen, Ur: NEGATIVE
Benzodiazepines.: POSITIVE — AB
Marijuana Metabolite: NEGATIVE
Opiates: POSITIVE — AB
Phencyclidine (PCP): NEGATIVE

## 2012-07-10 NOTE — Assessment & Plan Note (Signed)
BP Readings from Last 3 Encounters:  04/07/12 150/90  01/29/12 120/80  10/22/11 130/80   Generally well controlled - will continue present meds.

## 2012-07-10 NOTE — Assessment & Plan Note (Addendum)
No change in problem. He does manage OK on current pain therapy.  Urine drug screen positive only for opiates and benzos ( not quantitative)

## 2012-08-18 DIAGNOSIS — S43429A Sprain of unspecified rotator cuff capsule, initial encounter: Secondary | ICD-10-CM | POA: Diagnosis not present

## 2012-08-23 DIAGNOSIS — S43429A Sprain of unspecified rotator cuff capsule, initial encounter: Secondary | ICD-10-CM | POA: Diagnosis not present

## 2012-08-31 DIAGNOSIS — M25519 Pain in unspecified shoulder: Secondary | ICD-10-CM | POA: Diagnosis not present

## 2012-09-13 ENCOUNTER — Other Ambulatory Visit: Payer: Self-pay | Admitting: *Deleted

## 2012-09-13 MED ORDER — MELOXICAM 15 MG PO TABS: 15.0000 mg | ORAL_TABLET | Freq: Every day | ORAL | Status: DC

## 2012-09-13 NOTE — Telephone Encounter (Signed)
R'cd fax from [facility] for refill of Meloxicam.

## 2012-09-29 ENCOUNTER — Other Ambulatory Visit: Payer: Self-pay | Admitting: *Deleted

## 2012-09-29 DIAGNOSIS — M549 Dorsalgia, unspecified: Secondary | ICD-10-CM

## 2012-09-29 MED ORDER — OXYCODONE HCL 5 MG PO TABS: 5.0000 mg | ORAL_TABLET | ORAL | Status: DC | PRN

## 2012-09-29 MED ORDER — OXYCODONE HCL 40 MG PO TB12: 40.0000 mg | ORAL_TABLET | Freq: Three times a day (TID) | ORAL | Status: DC

## 2012-09-30 ENCOUNTER — Other Ambulatory Visit: Payer: Self-pay | Admitting: *Deleted

## 2012-09-30 MED ORDER — CYCLOBENZAPRINE HCL 10 MG PO TABS: 10.0000 mg | ORAL_TABLET | Freq: Three times a day (TID) | ORAL | Status: DC | PRN

## 2012-09-30 MED ORDER — OMEPRAZOLE 20 MG PO CPDR: 20.0000 mg | DELAYED_RELEASE_CAPSULE | Freq: Every day | ORAL | Status: DC

## 2012-10-05 ENCOUNTER — Ambulatory Visit (INDEPENDENT_AMBULATORY_CARE_PROVIDER_SITE_OTHER): Payer: Medicare Other

## 2012-10-05 DIAGNOSIS — Z23 Encounter for immunization: Secondary | ICD-10-CM | POA: Diagnosis not present

## 2012-11-08 ENCOUNTER — Telehealth: Payer: Self-pay | Admitting: Internal Medicine

## 2012-11-08 NOTE — Telephone Encounter (Signed)
Requesting hard copies for OxyContin and oxycodone.  He is requesting a 90 day supply.  He has had the MRI from Dr Ranell Patrick at [facility]. Has an appt wed with Dr. Ranell Patrick.

## 2012-11-08 NOTE — Telephone Encounter (Signed)
Ok for hard copy prescriptions if due.

## 2012-11-09 ENCOUNTER — Other Ambulatory Visit: Payer: Self-pay | Admitting: *Deleted

## 2012-11-09 DIAGNOSIS — M549 Dorsalgia, unspecified: Secondary | ICD-10-CM

## 2012-11-09 MED ORDER — OXYCODONE HCL 40 MG PO TB12: 40.0000 mg | ORAL_TABLET | Freq: Three times a day (TID) | ORAL | Status: DC

## 2012-11-09 MED ORDER — OXYCODONE HCL 5 MG PO TABS: 5.0000 mg | ORAL_TABLET | ORAL | Status: DC | PRN

## 2012-11-09 NOTE — Telephone Encounter (Signed)
Called pt and left vm that his prescripts are ready to pick up.

## 2012-11-10 DIAGNOSIS — M25519 Pain in unspecified shoulder: Secondary | ICD-10-CM | POA: Diagnosis not present

## 2012-12-22 DIAGNOSIS — M25519 Pain in unspecified shoulder: Secondary | ICD-10-CM | POA: Diagnosis not present

## 2013-01-12 ENCOUNTER — Other Ambulatory Visit: Payer: Self-pay

## 2013-01-12 MED ORDER — OMEPRAZOLE 20 MG PO CPDR: 20.0000 mg | DELAYED_RELEASE_CAPSULE | Freq: Every day | ORAL | Status: DC

## 2013-01-13 ENCOUNTER — Telehealth: Payer: Self-pay

## 2013-01-13 MED ORDER — CYCLOBENZAPRINE HCL 10 MG PO TABS: 10.0000 mg | ORAL_TABLET | Freq: Three times a day (TID) | ORAL | Status: DC | PRN

## 2013-01-13 MED ORDER — ALPRAZOLAM 1 MG PO TABS: 1.0000 mg | ORAL_TABLET | Freq: Three times a day (TID) | ORAL | Status: DC | PRN

## 2013-01-13 NOTE — Telephone Encounter (Signed)
Alprazolam called to pharmacy  

## 2013-01-19 DIAGNOSIS — M25519 Pain in unspecified shoulder: Secondary | ICD-10-CM | POA: Diagnosis not present

## 2013-02-08 ENCOUNTER — Telehealth: Payer: Self-pay

## 2013-02-08 NOTE — Telephone Encounter (Signed)
Not seen since 9/13. He needs OV w/any MD Thx

## 2013-02-08 NOTE — Telephone Encounter (Signed)
Information faxed back to pharmacy that pt needs an OV

## 2013-02-08 NOTE — Telephone Encounter (Signed)
Received a fax from [facility] pharmacy patient requesting scripts for the next 3 months on Oxycodone 5 mg and Oxycontin 40 mg. Please advise.

## 2013-02-10 ENCOUNTER — Other Ambulatory Visit (INDEPENDENT_AMBULATORY_CARE_PROVIDER_SITE_OTHER): Payer: Medicare Other

## 2013-02-10 ENCOUNTER — Ambulatory Visit (INDEPENDENT_AMBULATORY_CARE_PROVIDER_SITE_OTHER): Payer: Medicare Other | Admitting: Internal Medicine

## 2013-02-10 ENCOUNTER — Encounter: Payer: Self-pay | Admitting: Internal Medicine

## 2013-02-10 VITALS — BP 140/80 | HR 95 | Temp 97.8°F | Ht 66.0 in | Wt 176.1 lb

## 2013-02-10 DIAGNOSIS — R5381 Other malaise: Secondary | ICD-10-CM | POA: Diagnosis not present

## 2013-02-10 DIAGNOSIS — R5383 Other fatigue: Secondary | ICD-10-CM | POA: Insufficient documentation

## 2013-02-10 DIAGNOSIS — G5603 Carpal tunnel syndrome, bilateral upper limbs: Secondary | ICD-10-CM

## 2013-02-10 DIAGNOSIS — I10 Essential (primary) hypertension: Secondary | ICD-10-CM

## 2013-02-10 DIAGNOSIS — M549 Dorsalgia, unspecified: Secondary | ICD-10-CM

## 2013-02-10 HISTORY — DX: Carpal tunnel syndrome, bilateral upper limbs: G56.03

## 2013-02-10 LAB — HEPATIC FUNCTION PANEL
AST: 16 U/L (ref 0–37)
Albumin: 4.2 g/dL (ref 3.5–5.2)
Alkaline Phosphatase: 38 U/L — ABNORMAL LOW (ref 39–117)
Total Bilirubin: 0.6 mg/dL (ref 0.3–1.2)

## 2013-02-10 LAB — CBC WITH DIFFERENTIAL/PLATELET
Eosinophils Relative: 0.9 % (ref 0.0–5.0)
HCT: 40.8 % (ref 39.0–52.0)
Hemoglobin: 13.8 g/dL (ref 13.0–17.0)
Lymphs Abs: 2.2 10*3/uL (ref 0.7–4.0)
MCV: 88 fl (ref 78.0–100.0)
Monocytes Absolute: 0.9 10*3/uL (ref 0.1–1.0)
Monocytes Relative: 7.9 % (ref 3.0–12.0)
Neutro Abs: 8.6 10*3/uL — ABNORMAL HIGH (ref 1.4–7.7)
Platelets: 225 10*3/uL (ref 150.0–400.0)
RDW: 15 % — ABNORMAL HIGH (ref 11.5–14.6)

## 2013-02-10 LAB — URINALYSIS, ROUTINE W REFLEX MICROSCOPIC
Bilirubin Urine: NEGATIVE
Ketones, ur: NEGATIVE
Specific Gravity, Urine: <=1.005 (ref 1.000–1.030)
Urine Glucose: NEGATIVE
pH: 6 (ref 5.0–8.0)

## 2013-02-10 LAB — BASIC METABOLIC PANEL
Calcium: 8.8 mg/dL (ref 8.4–10.5)
GFR: 87.54 mL/min (ref >60.00)
Glucose, Bld: 82 mg/dL (ref 70–99)
Potassium: 4.4 mEq/L (ref 3.5–5.1)
Sodium: 136 mEq/L (ref 135–145)

## 2013-02-10 MED ORDER — OXYCODONE HCL 5 MG PO TABS: 5.0000 mg | ORAL_TABLET | ORAL | Status: DC | PRN

## 2013-02-10 MED ORDER — OXYCODONE HCL 40 MG PO TB12: 40.0000 mg | ORAL_TABLET | Freq: Three times a day (TID) | ORAL | Status: DC

## 2013-02-10 NOTE — Assessment & Plan Note (Signed)
stable overall by history and exam, recent data reviewed with pt - LS spine 2010, and pt to continue medical treatment as before,  to f/u any worsening symptoms or concerns, for med refills

## 2013-02-10 NOTE — Patient Instructions (Signed)
Please continue all other medications as before, and refills have been done if requested. Please go to the LAB in the Basement (turn left off the elevator) for the tests to be done today You will be contacted by phone if any changes need to be made immediately.  Otherwise, you will receive a letter about your results with an explanation Please remember to sign up for My Chart if you have not done so, as this will be important to you in the future with finding out test results, communicating by private email, and scheduling acute appointments online when needed.

## 2013-02-10 NOTE — Assessment & Plan Note (Signed)
Borderlines today,  BP Readings from Last 3 Encounters:  02/10/13 140/80  04/07/12 150/90  01/29/12 120/80    to f/u any worsening symptoms or concerns, for labs as well

## 2013-02-10 NOTE — Assessment & Plan Note (Signed)
Etiology unclear, Exam otherwise benign, to check labs as documented, follow with expectant management  

## 2013-02-10 NOTE — Progress Notes (Signed)
Subjective:    Patient ID: Phillip Mullen, male    DOB: December 25, 1965, 47 y.o.   MRN: [id number]  HPI  Pt of Dr Debby Bud, who is ill today/out of office.   Has seen Dr Ranell Patrick with rec for excercises/PT, but playing golf recently seems to exacerbate the left shoulder pain. Pt continues to have recurring LBP without change in severity, bowel or bladder change, fever, wt loss,  worsening LE pain/numbness/weakness, gait change or falls, holding off on lumbar ESI to work on left shoulder first. Does c/o ongoing fatigue, but denies signficant daytime hypersomnolence. Past Medical History  Diagnosis Date  . Anxiety   . GERD (gastroesophageal reflux disease)   . Headache   . Hypertension   . Osteoarthritis   . Bilateral carpal tunnel syndrome 02/10/2013    Per Dr Amanda Pea   Past Surgical History  Procedure Laterality Date  . Inguinal hernia repair    . Tonsillectomy    . Abcess drainage      Infected finger    reports that he has been smoking.  He does not have any smokeless tobacco history on file. His alcohol and drug histories are not on file. family history is not on file. Allergies  Allergen Reactions  . Penicillins     REACTION: hives  . Sulfonamide Derivatives    Current Outpatient Prescriptions on File Prior to Visit  Medication Sig Dispense Refill  . ALPRAZolam (XANAX) 1 MG tablet Take 1 tablet (1 mg total) by mouth 3 (three) times daily as needed.  90 tablet  5  . aspirin 81 MG tablet Take 81 mg by mouth daily.        . cetirizine (ZYRTEC) 10 MG tablet Take 1 tablet (10 mg total) by mouth daily.  30 tablet  12  . cyclobenzaprine (FLEXERIL) 10 MG tablet Take 1 tablet (10 mg total) by mouth 3 (three) times daily as needed.  90 tablet  2  . magnesium hydroxide (MILK OF MAGNESIA) 800 MG/5ML suspension Take 15 mLs by mouth daily as needed.        . meloxicam (MOBIC) 15 MG tablet Take 1 tablet (15 mg total) by mouth daily.  30 tablet  5  . omeprazole (PRILOSEC) 20 MG capsule Take 1  capsule (20 mg total) by mouth daily.  30 capsule  0   No current facility-administered medications on file prior to visit.   Review of Systems  Constitutional: Negative for unexpected weight change, or unusual diaphoresis  HENT: Negative for tinnitus.   Eyes: Negative for photophobia and visual disturbance.  Respiratory: Negative for choking and stridor.   Gastrointestinal: Negative for vomiting and blood in stool.  Genitourinary: Negative for hematuria and decreased urine volume.  Musculoskeletal: Negative for acute joint swelling Skin: Negative for color change and wound.  Neurological: Negative for tremors and numbness other than noted  Psychiatric/Behavioral: Negative for decreased concentration or  hyperactivity.       Objective:   Physical Exam BP 140/80  Pulse 95  Temp(Src) 97.8 F (36.6 C) (Oral)  Ht 5\' 6"  (1.676 m)  Wt 176 lb 2 oz (79.89 kg)  BMI 28.44 kg/m2  SpO2 98% VS noted, obese, walks stiffly with LBP, wearing back brace Constitutional: Pt appears well-developed and well-nourished.  HENT: Head: NCAT.  Right Ear: External ear normal.  Left Ear: External ear normal.  Eyes: Conjunctivae and EOM are normal. Pupils are equal, round, and reactive to light.  Neck: Normal range of motion. Neck supple.  Cardiovascular:  Normal rate and regular rhythm.   Pulmonary/Chest: Effort normal and breath sounds normal.  Abd:  Soft, NT, non-distended, + BS Spine: diffuse lower lumbar midline tender Neurological: Pt is alert. Not confused , motor grossly intact Skin: Skin is warm. No erythema. No rash Psychiatric: Pt behavior is normal..     Assessment & Plan:

## 2013-02-11 ENCOUNTER — Encounter: Payer: Self-pay | Admitting: Internal Medicine

## 2013-03-10 ENCOUNTER — Other Ambulatory Visit: Payer: Self-pay

## 2013-03-10 DIAGNOSIS — M549 Dorsalgia, unspecified: Secondary | ICD-10-CM

## 2013-03-10 MED ORDER — OXYCODONE HCL 5 MG PO TABS: 5.0000 mg | ORAL_TABLET | ORAL | Status: DC | PRN

## 2013-03-10 MED ORDER — OXYCODONE HCL 40 MG PO TB12: 40.0000 mg | ORAL_TABLET | Freq: Three times a day (TID) | ORAL | Status: DC

## 2013-03-10 NOTE — Telephone Encounter (Signed)
Pharmacy faxed request for refills on these meds

## 2013-03-10 NOTE — Telephone Encounter (Signed)
Done hardcopy to robin  

## 2013-03-11 NOTE — Telephone Encounter (Signed)
Notified pt rx's ready for pick-up../lmb 

## 2013-04-08 ENCOUNTER — Other Ambulatory Visit: Payer: Self-pay

## 2013-04-08 DIAGNOSIS — M549 Dorsalgia, unspecified: Secondary | ICD-10-CM

## 2013-04-08 MED ORDER — OXYCODONE HCL 5 MG PO TABS: 5.0000 mg | ORAL_TABLET | ORAL | Status: DC | PRN

## 2013-04-08 MED ORDER — OXYCODONE HCL 40 MG PO TB12: 40.0000 mg | ORAL_TABLET | Freq: Three times a day (TID) | ORAL | Status: DC

## 2013-04-08 NOTE — Telephone Encounter (Signed)
Okay for refill?  

## 2013-04-08 NOTE — Telephone Encounter (Signed)
Patient notified his scripts can be picked up

## 2013-04-11 ENCOUNTER — Other Ambulatory Visit: Payer: Self-pay

## 2013-04-12 ENCOUNTER — Other Ambulatory Visit: Payer: Self-pay

## 2013-04-12 MED ORDER — MELOXICAM 15 MG PO TABS: 15.0000 mg | ORAL_TABLET | Freq: Every day | ORAL | Status: DC

## 2013-04-12 MED ORDER — OMEPRAZOLE 20 MG PO CPDR: 20.0000 mg | DELAYED_RELEASE_CAPSULE | Freq: Every day | ORAL | Status: DC

## 2013-04-12 NOTE — Addendum Note (Signed)
Addended by: Lyanne Co R on: 04/12/2013 08:12 AM   Modules accepted: Orders

## 2013-04-14 DIAGNOSIS — G894 Chronic pain syndrome: Secondary | ICD-10-CM | POA: Diagnosis not present

## 2013-04-14 DIAGNOSIS — M25519 Pain in unspecified shoulder: Secondary | ICD-10-CM | POA: Diagnosis not present

## 2013-05-09 ENCOUNTER — Other Ambulatory Visit: Payer: Self-pay

## 2013-05-09 DIAGNOSIS — M549 Dorsalgia, unspecified: Secondary | ICD-10-CM

## 2013-05-09 NOTE — Telephone Encounter (Signed)
Ok for 3 scripts

## 2013-05-09 NOTE — Telephone Encounter (Signed)
Pharmacy faxed a refill for these scripts to be filled.

## 2013-05-10 MED ORDER — OXYCODONE HCL 5 MG PO TABS: 5.0000 mg | ORAL_TABLET | ORAL | Status: DC | PRN

## 2013-05-10 MED ORDER — OXYCODONE HCL 40 MG PO TB12: 40.0000 mg | ORAL_TABLET | Freq: Three times a day (TID) | ORAL | Status: DC

## 2013-05-10 NOTE — Telephone Encounter (Signed)
Scripts printed for signature. 

## 2013-07-11 ENCOUNTER — Other Ambulatory Visit: Payer: Self-pay

## 2013-07-11 DIAGNOSIS — M549 Dorsalgia, unspecified: Secondary | ICD-10-CM

## 2013-07-11 MED ORDER — ALPRAZOLAM 1 MG PO TABS: 1.0000 mg | ORAL_TABLET | Freq: Three times a day (TID) | ORAL | Status: DC | PRN

## 2013-07-11 NOTE — Telephone Encounter (Signed)
Called refill into pharmacy spoke with Asher Muir gave md approval...lmb

## 2013-07-29 ENCOUNTER — Ambulatory Visit (INDEPENDENT_AMBULATORY_CARE_PROVIDER_SITE_OTHER): Payer: Medicare Other | Admitting: Internal Medicine

## 2013-07-29 ENCOUNTER — Other Ambulatory Visit (INDEPENDENT_AMBULATORY_CARE_PROVIDER_SITE_OTHER): Payer: Medicare Other

## 2013-07-29 ENCOUNTER — Encounter: Payer: Self-pay | Admitting: Internal Medicine

## 2013-07-29 VITALS — BP 144/90 | HR 93 | Temp 98.3°F | Ht 63.0 in | Wt 182.0 lb

## 2013-07-29 DIAGNOSIS — M549 Dorsalgia, unspecified: Secondary | ICD-10-CM | POA: Diagnosis not present

## 2013-07-29 DIAGNOSIS — R350 Frequency of micturition: Secondary | ICD-10-CM | POA: Diagnosis not present

## 2013-07-29 DIAGNOSIS — N401 Enlarged prostate with lower urinary tract symptoms: Secondary | ICD-10-CM | POA: Insufficient documentation

## 2013-07-29 LAB — URINALYSIS, ROUTINE W REFLEX MICROSCOPIC
Bilirubin Urine: NEGATIVE
Hgb urine dipstick: NEGATIVE
Ketones, ur: NEGATIVE
Total Protein, Urine: NEGATIVE
Urine Glucose: NEGATIVE
Urobilinogen, UA: 0.2 (ref 0.0–1.0)

## 2013-07-29 MED ORDER — OXYCODONE HCL 5 MG PO TABS: 5.0000 mg | ORAL_TABLET | ORAL | Status: DC | PRN

## 2013-07-29 MED ORDER — OXYCODONE HCL ER 40 MG PO T12A: 40.0000 mg | EXTENDED_RELEASE_TABLET | Freq: Three times a day (TID) | ORAL | Status: DC

## 2013-07-29 MED ORDER — CIPROFLOXACIN HCL 500 MG PO TABS: 500.0000 mg | ORAL_TABLET | Freq: Two times a day (BID) | ORAL | Status: DC

## 2013-07-29 MED ORDER — CYCLOBENZAPRINE HCL 10 MG PO TABS: 10.0000 mg | ORAL_TABLET | Freq: Three times a day (TID) | ORAL | Status: DC | PRN

## 2013-07-29 MED ORDER — TAMSULOSIN HCL 0.4 MG PO CAPS: 0.4000 mg | ORAL_CAPSULE | Freq: Every day | ORAL | Status: DC

## 2013-07-29 NOTE — Patient Instructions (Signed)
Please take all new medication as prescribed - the antibiotic, and generic flomax for "relaxing" the prostate to urinate easier Please continue all other medications as before, and refills have been done  - the pain medications, and the muscle relaxer Please go to the LAB in the Basement (turn left off the elevator) for the tests to be done today - just urine tests today You will be contacted by phone if any changes need to be made immediately.  Otherwise, you will receive a letter about your results with an explanation, but please check with MyChart first.  Please call if you change your mind about the MRI and surgury evaluation  Please consider follow up with Dr Debby Bud in 2-3 wks

## 2013-07-29 NOTE — Progress Notes (Signed)
Subjective:    Patient ID: Phillip Mullen, male    DOB: [birth year], 47 y.o.   MRN: [id number]  HPI  Here with intermittent difficulty with urination described as 2 wks difficutlly getting started, weaker stream, and dribbling, also with nocturia 3-4 times at night, unusual for him.  Has chronic LBP, worse on the left with worsening pain/weakness/numbness, cant say when got worse, no falls, walking more bent over. Denies urinary symptoms such as dysuria, frequency, urgency, flank pain, hematuria or n/v, fever, chills. No hx of prostiatis or UTI.  Does not want MRI (last done 2010) or surgical eval due to cost. Past Medical History  Diagnosis Date  . Anxiety   . GERD (gastroesophageal reflux disease)   . Headache(784.0)   . Hypertension   . Osteoarthritis   . Bilateral carpal tunnel syndrome 02/10/2013    Per Dr Amanda Pea   Past Surgical History  Procedure Laterality Date  . Inguinal hernia repair    . Tonsillectomy    . Abcess drainage      Infected finger    reports that he has been smoking.  He does not have any smokeless tobacco history on file. His alcohol and drug histories are not on file. family history is not on file. Allergies  Allergen Reactions  . Penicillins     REACTION: hives  . Sulfonamide Derivatives    Current Outpatient Prescriptions on File Prior to Visit  Medication Sig Dispense Refill  . ALPRAZolam (XANAX) 1 MG tablet Take 1 tablet (1 mg total) by mouth 3 (three) times daily as needed.  90 tablet  5  . aspirin 81 MG tablet Take 81 mg by mouth daily.        . cetirizine (ZYRTEC) 10 MG tablet Take 1 tablet (10 mg total) by mouth daily.  30 tablet  12  . cyclobenzaprine (FLEXERIL) 10 MG tablet Take 1 tablet (10 mg total) by mouth 3 (three) times daily as needed.  90 tablet  2  . magnesium hydroxide (MILK OF MAGNESIA) 800 MG/5ML suspension Take 15 mLs by mouth daily as needed.        . meloxicam (MOBIC) 15 MG tablet Take 1 tablet (15 mg total) by mouth daily.  30  tablet  5  . omeprazole (PRILOSEC) 20 MG capsule Take 1 capsule (20 mg total) by mouth daily.  30 capsule  5  . oxyCODONE (OXY IR/ROXICODONE) 5 MG immediate release tablet Take 1 tablet (5 mg total) by mouth every 4 (four) hours as needed. Fill on or after 07/12/13.  120 tablet  0  . oxyCODONE (OXYCONTIN) 40 MG 12 hr tablet Take 1 tablet (40 mg total) by mouth 3 (three) times daily. Fill on or after 07/12/13  90 tablet  0   No current facility-administered medications on file prior to visit.   Review of Systems All otherwise neg per pt     Objective:   Physical Exam BP 144/90  Pulse 93  Temp(Src) 98.3 F (36.8 C) (Oral)  Ht 5\' 3"  (1.6 m)  Wt 182 lb (82.555 kg)  BMI 32.25 kg/m2  SpO2 97% VS noted, non toxic but in pain, slow and bent at waist to ambulate, struggles to get up on exam table without assist Constitutional: Pt appears well-developed and well-nourished.  HENT: Head: NCAT.  Right Ear: External ear normal.  Left Ear: External ear normal.  Eyes: Conjunctivae and EOM are normal. Pupils are equal, round, and reactive to light.  Neck: Normal range of motion.  Neck supple.  Cardiovascular: Normal rate and regular rhythm.   Pulmonary/Chest: Effort normal and breath sounds normal.  Abd:  Soft, NT, non-distended, + BS DRE:  Normal tone, somewhat painful exam for pt but with ? Prostate tender but not swollen or nodule, no rectal mass, heme neg Neurological: Pt is alert. Not confused , motor 4+/5 LLE, with mild decreased sens Skin: Skin is warm. No erythema.  Psychiatric: Pt behavior is normal. Thought content normal.  Spine: has palpable left paravertebral lumbar spasm/tender    Assessment & Plan:

## 2013-07-29 NOTE — Assessment & Plan Note (Signed)
?   prostatis  - ok for cipro/flomax course, urine studies, but cant ro possiblity of symtpoms related to ls spine worsening, again pt declines furhter eval for scheduled f/u for now with PCP

## 2013-07-29 NOTE — Assessment & Plan Note (Addendum)
Not clear if worsening neuro status except for symptoms,  Has lumbar spasm, ok for flexeril prn, pain med refilled for pt as he has transportation issues as well; I think should prob have MRI LS Spine re-eval and surgical eval but he declines due to cost

## 2013-09-07 ENCOUNTER — Encounter: Payer: Self-pay | Admitting: Internal Medicine

## 2013-09-07 ENCOUNTER — Ambulatory Visit (INDEPENDENT_AMBULATORY_CARE_PROVIDER_SITE_OTHER): Payer: Medicare Other | Admitting: Internal Medicine

## 2013-09-07 VITALS — BP 150/94 | HR 98 | Temp 97.2°F | Wt 171.0 lb

## 2013-09-07 DIAGNOSIS — M549 Dorsalgia, unspecified: Secondary | ICD-10-CM | POA: Diagnosis not present

## 2013-09-07 DIAGNOSIS — I10 Essential (primary) hypertension: Secondary | ICD-10-CM | POA: Diagnosis not present

## 2013-09-07 DIAGNOSIS — N401 Enlarged prostate with lower urinary tract symptoms: Secondary | ICD-10-CM

## 2013-09-07 DIAGNOSIS — Z0000 Encounter for general adult medical examination without abnormal findings: Secondary | ICD-10-CM

## 2013-09-07 DIAGNOSIS — Z23 Encounter for immunization: Secondary | ICD-10-CM

## 2013-09-07 DIAGNOSIS — R339 Retention of urine, unspecified: Secondary | ICD-10-CM

## 2013-09-07 MED ORDER — OXYCODONE HCL 5 MG PO TABS: 5.0000 mg | ORAL_TABLET | ORAL | Status: DC | PRN

## 2013-09-07 MED ORDER — OXYCODONE HCL ER 40 MG PO T12A: 40.0000 mg | EXTENDED_RELEASE_TABLET | Freq: Three times a day (TID) | ORAL | Status: DC

## 2013-09-07 NOTE — Progress Notes (Signed)
Pre visit review using our clinic review tool, if applicable. No additional management support is needed unless otherwise documented below in the visit note. 

## 2013-09-07 NOTE — Progress Notes (Signed)
Subjective:    Patient ID: Phillip Mullen, male    DOB: October 13, 1966, 47 y.o.   MRN: [id number]  HPI Phillip Mullen presents for follow up. He was seen most recently by Dr. Jonny Ruiz July 29, 2013 for a complete physical - note and labs reviewed. At that visit he had presented for an infection: UTI vs prostatitis. He was treated with Cipro and flomax. He felt a lot better, however, he had recurrent problem with urinary flow when he ran out of flomax suggesting an underlying BPH.  He continues to have back and neck pain - tension headaches.  He has been using Sudafed 24 hr. For increased cold/allergy symptoms  Past Medical History  Diagnosis Date  . Anxiety   . GERD (gastroesophageal reflux disease)   . Headache(784.0)   . Hypertension   . Osteoarthritis   . Bilateral carpal tunnel syndrome 02/10/2013    Per Dr Amanda Pea   Past Surgical History  Procedure Laterality Date  . Inguinal hernia repair    . Tonsillectomy    . Abcess drainage      Infected finger   History reviewed. No pertinent family history. History   Social History  . Marital Status: Single    Spouse Name: N/A    Number of Children: N/A  . Years of Education: N/A   Occupational History  . Not on file.   Social History Main Topics  . Smoking status: Current Every Day Smoker -- 1.00 packs/day  . Smokeless tobacco: Not on file  . Alcohol Use: Not on file  . Drug Use: Not on file  . Sexual Activity: Not on file   Other Topics Concern  . Not on file   Social History Narrative   1 year of college   HVAC until disabled   Long term monogamous relationship   Current smoker   Alcohol use- no   Drug use- no   Regular exercise- no    Current Outpatient Prescriptions on File Prior to Visit  Medication Sig Dispense Refill  . ALPRAZolam (XANAX) 1 MG tablet Take 1 tablet (1 mg total) by mouth 3 (three) times daily as needed.  90 tablet  5  . aspirin 81 MG tablet Take 81 mg by mouth daily.        . cetirizine  (ZYRTEC) 10 MG tablet Take 1 tablet (10 mg total) by mouth daily.  30 tablet  12  . ciprofloxacin (CIPRO) 500 MG tablet Take 1 tablet (500 mg total) by mouth 2 (two) times daily.  20 tablet  0  . cyclobenzaprine (FLEXERIL) 10 MG tablet Take 1 tablet (10 mg total) by mouth 3 (three) times daily as needed.  90 tablet  2  . magnesium hydroxide (MILK OF MAGNESIA) 800 MG/5ML suspension Take 15 mLs by mouth daily as needed.        . meloxicam (MOBIC) 15 MG tablet Take 1 tablet (15 mg total) by mouth daily.  30 tablet  5  . omeprazole (PRILOSEC) 20 MG capsule Take 1 capsule (20 mg total) by mouth daily.  30 capsule  5  . oxyCODONE (OXY IR/ROXICODONE) 5 MG immediate release tablet Take 1 tablet (5 mg total) by mouth every 4 (four) hours as needed. Fill on or after 08/11/13.  120 tablet  0  . OxyCODONE (OXYCONTIN) 40 mg T12A 12 hr tablet Take 1 tablet (40 mg total) by mouth 3 (three) times daily. To fill Aug 11, 2013  90 tablet  0  . tamsulosin (FLOMAX)  0.4 MG CAPS capsule Take 1 capsule (0.4 mg total) by mouth daily.  30 capsule  1   No current facility-administered medications on file prior to visit.      Review of Systems System review is negative for any constitutional, cardiac, pulmonary, GI or neuro symptoms or complaints other than as described in the HPI.     Objective:   Physical Exam Filed Vitals:   09/07/13 1501  BP: 150/94  Pulse: 98  Temp: 97.2 F (36.2 C)   Wt Readings from Last 3 Encounters:  09/07/13 171 lb (77.565 kg)  07/29/13 182 lb (82.555 kg)  02/10/13 176 lb 2 oz (79.89 kg)   BP Readings from Last 3 Encounters:  09/07/13 150/94  07/29/13 144/90  02/10/13 140/80   Gen'l - older man in no distress HEENT- C&S clear Cor - RRR Pulm - good breath sounds MSK - increased muscle tension and tenderness T1, C7-C3, muscle tension in rhomboids and trapezius.  Recent Results (from the past 2160 hour(s))  URINALYSIS, ROUTINE W REFLEX MICROSCOPIC     Status: None    Collection Time    07/29/13  3:56 PM      Result Value Range   Color, Urine LT. YELLOW  Yellow;Lt. Yellow   APPearance CLEAR  Clear   Specific Gravity, Urine <=1.005  1.000 - 1.030   pH 6.5  5.0 - 8.0   Total Protein, Urine NEGATIVE  Negative   Urine Glucose NEGATIVE  Negative   Ketones, ur NEGATIVE  Negative   Bilirubin Urine NEGATIVE  Negative   Hgb urine dipstick NEGATIVE  Negative   Urobilinogen, UA 0.2  0.0 - 1.0   Leukocytes, UA NEGATIVE  Negative   Nitrite NEGATIVE  Negative   Squamous Epithelial / LPF Rare(0-4/hpf)  Rare(0-4/hpf)  URINE CULTURE     Status: None   Collection Time    07/29/13  3:56 PM      Result Value Range   Colony Count NO GROWTH     Organism ID, Bacteria NO GROWTH     CMet April 17th - normal with Cr 1.0 glucose 82, LFTs normal, CBC normal, TSH normal.       Assessment & Plan:

## 2013-09-07 NOTE — Patient Instructions (Addendum)
1. Slowed urinary stream that was improved by Flomax is consistent with benign enlargement of the Prostate (BPH) with partial bladder outlet obstruction. Plan Resume the Flomax  2. Pain in the neck and upper back - degenerative changes in the cervical and thoracic spine Plan Home PT: neck rolls and shoulder shrugs holding 5 lb weights  Using a firm 3 inch ball (babies basketball): lying on your back move the ball up 1 vertebra at a time and relax your full weight into it.  Get some help with the computer (the [employer]) and go to [employer] and search back pain and myofascial massage with ball.  3. Health Maintenance - will give a flu shot today. You had a full exa mwith Dr. Jonny Ruiz in October.  4. Chronic pain management - refills today. Also a controlled substance contract  5. For congestion - continue the zyrtec for the drip and it is ok to use sudafed 30 mg twice a day for sinus congestion.

## 2013-09-09 DIAGNOSIS — Z0000 Encounter for general adult medical examination without abnormal findings: Secondary | ICD-10-CM | POA: Insufficient documentation

## 2013-09-09 NOTE — Assessment & Plan Note (Signed)
Health Maintenance - will give a flu shot today. You had a full exam with Dr. Jonny Ruiz in October. Last complete labs in April '14 - reviewed: all in normal limits. Due for colonoscopy at 48. Immunizations - Tetanus done. Flu shot given today.

## 2013-09-09 NOTE — Assessment & Plan Note (Signed)
No change in condition. He is controlled on present dose of narcotics. He has no GI or cognitive disturbance.  Plan Refill Rx provided.   Controlled substance contract executed.

## 2013-09-09 NOTE — Assessment & Plan Note (Signed)
Slowed urinary stream that was improved by Flomax is consistent with benign enlargement of the Prostate (BPH) with partial bladder outlet obstruction. Plan Resume the Flomax

## 2013-09-09 NOTE — Assessment & Plan Note (Signed)
BP Readings from Last 3 Encounters:  09/07/13 150/94  07/29/13 144/90  02/10/13 140/80   Borderline control of BP.  Plan Watchful care.

## 2013-09-13 ENCOUNTER — Other Ambulatory Visit: Payer: Self-pay

## 2013-09-13 MED ORDER — CETIRIZINE HCL 10 MG PO TABS: 10.0000 mg | ORAL_TABLET | Freq: Every day | ORAL | Status: DC

## 2013-10-10 ENCOUNTER — Other Ambulatory Visit: Payer: Self-pay | Admitting: *Deleted

## 2013-10-10 MED ORDER — TAMSULOSIN HCL 0.4 MG PO CAPS: 0.4000 mg | ORAL_CAPSULE | Freq: Every day | ORAL | Status: DC

## 2013-12-08 ENCOUNTER — Ambulatory Visit (INDEPENDENT_AMBULATORY_CARE_PROVIDER_SITE_OTHER): Payer: Medicare Other | Admitting: Internal Medicine

## 2013-12-08 ENCOUNTER — Encounter: Payer: Self-pay | Admitting: Internal Medicine

## 2013-12-08 VITALS — BP 150/84 | HR 99 | Temp 97.8°F | Wt 175.0 lb

## 2013-12-08 DIAGNOSIS — I10 Essential (primary) hypertension: Secondary | ICD-10-CM | POA: Diagnosis not present

## 2013-12-08 DIAGNOSIS — F411 Generalized anxiety disorder: Secondary | ICD-10-CM

## 2013-12-08 DIAGNOSIS — M549 Dorsalgia, unspecified: Secondary | ICD-10-CM

## 2013-12-08 DIAGNOSIS — G5600 Carpal tunnel syndrome, unspecified upper limb: Secondary | ICD-10-CM | POA: Diagnosis not present

## 2013-12-08 DIAGNOSIS — Z79899 Other long term (current) drug therapy: Secondary | ICD-10-CM | POA: Diagnosis not present

## 2013-12-08 DIAGNOSIS — G5603 Carpal tunnel syndrome, bilateral upper limbs: Secondary | ICD-10-CM

## 2013-12-08 MED ORDER — ALPRAZOLAM 1 MG PO TABS: 1.0000 mg | ORAL_TABLET | Freq: Three times a day (TID) | ORAL | Status: DC | PRN

## 2013-12-08 MED ORDER — OXYCODONE HCL 5 MG PO TABS: 5.0000 mg | ORAL_TABLET | ORAL | Status: DC | PRN

## 2013-12-08 MED ORDER — OMEPRAZOLE 20 MG PO CPDR: 20.0000 mg | DELAYED_RELEASE_CAPSULE | Freq: Every day | ORAL | Status: DC

## 2013-12-08 MED ORDER — OXYCODONE HCL ER 40 MG PO T12A: 40.0000 mg | EXTENDED_RELEASE_TABLET | Freq: Three times a day (TID) | ORAL | Status: DC

## 2013-12-08 MED ORDER — OXYCODONE HCL ER 40 MG PO T12A
40.0000 mg | EXTENDED_RELEASE_TABLET | Freq: Three times a day (TID) | ORAL | Status: DC
Start: 2013-12-08 — End: 2013-12-08

## 2013-12-08 NOTE — Patient Instructions (Signed)
Arm swelling - this is most likely to be swelling related to carpal tunnel syndrome rather than any drug interaction  Plan Wear the cock up wrist splint at night - this should help a lot  You may be at the point where you need to see Dr. Butler DenmarkGrammig about repair.   Will set you up with [facility].

## 2013-12-08 NOTE — Progress Notes (Signed)
Subjective:    Patient ID: Phillip Mullen, male    DOB: [birth year], 47 y.o.   MRN: [id number]  HPI Phillip Mullen presents for intermittent swelling of the right forearm. He thought it might be a drug-drug interaction between Tamsulosin and Mobic or omeprazole. He does have carpal tunnel syndrome which is a possible cause of the swelling since he will awaken with a swollen arm. He has had NCS as part of his work up and he has seen Dr. Butler Denmark.   Past Medical History  Diagnosis Date  . Anxiety   . GERD (gastroesophageal reflux disease)   . Headache(784.0)   . Hypertension   . Osteoarthritis   . Bilateral carpal tunnel syndrome 02/10/2013    Per Dr Amanda Pea   Past Surgical History  Procedure Laterality Date  . Inguinal hernia repair    . Tonsillectomy    . Abcess drainage      Infected finger   History reviewed. No pertinent family history. History   Social History  . Marital Status: Single    Spouse Name: N/A    Number of Children: N/A  . Years of Education: N/A   Occupational History  . Not on file.   Social History Main Topics  . Smoking status: Current Every Day Smoker -- 1.00 packs/day    Types: Cigarettes  . Smokeless tobacco: Not on file  . Alcohol Use: Yes     Comment: occasional  . Drug Use: No  . Sexual Activity: Not on file   Other Topics Concern  . Not on file   Social History Narrative   1 year of college   HVAC until disabled   Long term monogamous relationship   Current smoker   Alcohol use- no   Drug use- no   Regular exercise- no    Current Outpatient Prescriptions on File Prior to Visit  Medication Sig Dispense Refill  . ALPRAZolam (XANAX) 1 MG tablet Take 1 tablet (1 mg total) by mouth 3 (three) times daily as needed.  90 tablet  5  . aspirin 81 MG tablet Take 81 mg by mouth daily.        . cetirizine (ZYRTEC) 10 MG tablet Take 1 tablet (10 mg total) by mouth daily.  30 tablet  12  . cyclobenzaprine (FLEXERIL) 10 MG tablet Take 1  tablet (10 mg total) by mouth 3 (three) times daily as needed.  90 tablet  2  . magnesium hydroxide (MILK OF MAGNESIA) 800 MG/5ML suspension Take 15 mLs by mouth daily as needed.        . meloxicam (MOBIC) 15 MG tablet Take 1 tablet (15 mg total) by mouth daily.  30 tablet  5  . omeprazole (PRILOSEC) 20 MG capsule Take 1 capsule (20 mg total) by mouth daily.  30 capsule  5  . oxyCODONE (OXY IR/ROXICODONE) 5 MG immediate release tablet Take 1 tablet (5 mg total) by mouth every 4 (four) hours as needed. Fill on or after 11/10/13.  120 tablet  0  . OxyCODONE (OXYCONTIN) 40 mg T12A 12 hr tablet Take 1 tablet (40 mg total) by mouth 3 (three) times daily. To fill on or after 11/10/2013  90 tablet  0  . tamsulosin (FLOMAX) 0.4 MG CAPS capsule Take 1 capsule (0.4 mg total) by mouth daily.  30 capsule  5   No current facility-administered medications on file prior to visit.      Review of Systems System review is negative for any constitutional,  cardiac, pulmonary, GI or neuro symptoms or complaints other than as described in the HPI.        Objective:   Physical Exam Filed Vitals:   12/08/13 1131  BP: 150/84  Pulse: 99  Temp: 97.8 F (36.6 C)   Gen'l- WNWD man Cor- 2+ radial Pulm - normal respirations MSK - deformity DIP, PIP joints both hands. Decreased ROM right wrist. Neuro - awake and alert, speech a little slurred - chronic speech pattern       Assessment & Plan:

## 2013-12-08 NOTE — Progress Notes (Signed)
Pre visit review using our clinic review tool, if applicable. No additional management support is needed unless otherwise documented below in the visit note. 

## 2013-12-09 ENCOUNTER — Telehealth: Payer: Self-pay | Admitting: *Deleted

## 2013-12-09 NOTE — Telephone Encounter (Signed)
Phoned Alfonse SpruceBrian, RPh with MD response.

## 2013-12-09 NOTE — Telephone Encounter (Signed)
yes

## 2013-12-09 NOTE — Assessment & Plan Note (Signed)
No chnge in condition. Continues on chronic pain management

## 2013-12-09 NOTE — Telephone Encounter (Signed)
Arlys JohnBrian, Pharmacist phoned regarding patient's oxycodone & oxycontin scripts that are scheduled to be filled 12/11/13-pharmacy closed that day-can they be filled tomorrow (14th?)   CB# [phone]

## 2013-12-09 NOTE — Assessment & Plan Note (Signed)
He has tried and failed SSRIs, Wellbutrin, BuSpar. Xanax works for him. Cannot affort Xanax XR  Plan Renewed Rx

## 2013-12-09 NOTE — Assessment & Plan Note (Signed)
Progressive pain with nocturnal awakening. He also is having swelling right wrist and forearm on awakening.  Plan Cock-up wrist splint right  Follow up with Dr. Butler DenmarkGrammig

## 2013-12-09 NOTE — Assessment & Plan Note (Signed)
BP Readings from Last 3 Encounters:  12/08/13 150/84  09/07/13 150/94  07/29/13 144/90

## 2014-01-06 ENCOUNTER — Encounter: Payer: Self-pay | Admitting: Internal Medicine

## 2014-01-19 ENCOUNTER — Ambulatory Visit (INDEPENDENT_AMBULATORY_CARE_PROVIDER_SITE_OTHER): Payer: Medicare Other | Admitting: Internal Medicine

## 2014-01-19 ENCOUNTER — Encounter: Payer: Self-pay | Admitting: Internal Medicine

## 2014-01-19 VITALS — BP 150/80 | HR 100 | Temp 98.5°F | Wt 175.8 lb

## 2014-01-19 DIAGNOSIS — M549 Dorsalgia, unspecified: Secondary | ICD-10-CM | POA: Diagnosis not present

## 2014-01-19 DIAGNOSIS — J309 Allergic rhinitis, unspecified: Secondary | ICD-10-CM

## 2014-01-19 MED ORDER — OXYCODONE HCL ER 40 MG PO T12A: 40.0000 mg | EXTENDED_RELEASE_TABLET | Freq: Three times a day (TID) | ORAL | Status: DC

## 2014-01-19 MED ORDER — OXYCODONE HCL 5 MG PO TABS: 5.0000 mg | ORAL_TABLET | ORAL | Status: DC | PRN

## 2014-01-19 MED ORDER — AZITHROMYCIN 500 MG PO TABS: 500.0000 mg | ORAL_TABLET | Freq: Every day | ORAL | Status: DC

## 2014-01-19 MED ORDER — FLUTICASONE PROPIONATE 50 MCG/ACT NA SUSP: 2.0000 | Freq: Every day | NASAL | Status: DC

## 2014-01-19 NOTE — Assessment & Plan Note (Signed)
Seasonal flair of allergic symptoms and probable infection.  Plan Azithromycin 500 mg qd x 3  Nasonex 1 spray to each nostril daily  Saline nasal spray  Tylenol as needed for fever

## 2014-01-19 NOTE — Patient Instructions (Signed)
Seasonal flair of allergic symptoms and probable infection.  Plan Azithromycin 500 mg qd x 3  Nasonex 1 spray to each nostril daily  Saline nasal spray  Tylenol as needed for fever  Allergic Rhinitis Allergic rhinitis is when the mucous membranes in the nose respond to allergens. Allergens are particles in the air that cause your body to have an allergic reaction. This causes you to release allergic antibodies. Through a chain of events, these eventually cause you to release histamine into the blood stream. Although meant to protect the body, it is this release of histamine that causes your discomfort, such as frequent sneezing, congestion, and an itchy, runny nose.  CAUSES  Seasonal allergic rhinitis (hay fever) is caused by pollen allergens that may come from grasses, trees, and weeds. Year-round allergic rhinitis (perennial allergic rhinitis) is caused by allergens such as house dust mites, pet dander, and mold spores.  SYMPTOMS   Nasal stuffiness (congestion).  Itchy, runny nose with sneezing and tearing of the eyes. DIAGNOSIS  Your health care provider can help you determine the allergen or allergens that trigger your symptoms. If you and your health care provider are unable to determine the allergen, skin or blood testing may be used. TREATMENT  Allergic Rhinitis does not have a cure, but it can be controlled by:  Medicines and allergy shots (immunotherapy).  Avoiding the allergen. Hay fever may often be treated with antihistamines in pill or nasal spray forms. Antihistamines block the effects of histamine. There are over-the-counter medicines that may help with nasal congestion and swelling around the eyes. Check with your health care provider before taking or giving this medicine.  If avoiding the allergen or the medicine prescribed do not work, there are many new medicines your health care provider can prescribe. Stronger medicine may be used if initial measures are ineffective.  Desensitizing injections can be used if medicine and avoidance does not work. Desensitization is when a patient is given ongoing shots until the body becomes less sensitive to the allergen. Make sure you follow up with your health care provider if problems continue. HOME CARE INSTRUCTIONS It is not possible to completely avoid allergens, but you can reduce your symptoms by taking steps to limit your exposure to them. It helps to know exactly what you are allergic to so that you can avoid your specific triggers. SEEK MEDICAL CARE IF:   You have a fever.  You develop a cough that does not stop easily (persistent).  You have shortness of breath.  You start wheezing.  Symptoms interfere with normal daily activities. Document Released: 07/08/2001 Document Revised: 08/03/2013 Document Reviewed: 06/20/2013 [facility] Patient Information 2014 [place].

## 2014-01-19 NOTE — Progress Notes (Signed)
   Subjective:    Patient ID: Phillip Mullen, male    DOB: 02/15/1966, 48 y.o.   MRN: [id number]  HPI H/o seasonal allergy now with drainage productive cough, moving to his chest. He feels bad. He has use nasal steroid sprays in the past.  PMH, FamHx and SocHx reviewed for any changes and relevance.  Current Outpatient Prescriptions on File Prior to Visit  Medication Sig Dispense Refill  . ALPRAZolam (XANAX) 1 MG tablet Take 1 tablet (1 mg total) by mouth 3 (three) times daily as needed.  90 tablet  5  . aspirin 81 MG tablet Take 81 mg by mouth daily.        . cetirizine (ZYRTEC) 10 MG tablet Take 1 tablet (10 mg total) by mouth daily.  30 tablet  12  . cyclobenzaprine (FLEXERIL) 10 MG tablet Take 1 tablet (10 mg total) by mouth 3 (three) times daily as needed.  90 tablet  2  . magnesium hydroxide (MILK OF MAGNESIA) 800 MG/5ML suspension Take 15 mLs by mouth daily as needed.        . meloxicam (MOBIC) 15 MG tablet Take 1 tablet (15 mg total) by mouth daily.  30 tablet  5  . omeprazole (PRILOSEC) 20 MG capsule Take 1 capsule (20 mg total) by mouth daily.  30 capsule  11  . oxyCODONE (OXY IR/ROXICODONE) 5 MG immediate release tablet Take 1 tablet (5 mg total) by mouth every 4 (four) hours as needed. Fill on or after 02/08/14.  120 tablet  0  . OxyCODONE (OXYCONTIN) 40 mg T12A 12 hr tablet Take 1 tablet (40 mg total) by mouth 3 (three) times daily. To fill on or after 02/08/2014  90 tablet  0  . tamsulosin (FLOMAX) 0.4 MG CAPS capsule Take 1 capsule (0.4 mg total) by mouth daily.  30 capsule  5   No current facility-administered medications on file prior to visit.      Review of Systems System review is negative for any constitutional, cardiac, pulmonary, GI or neuro symptoms or complaints other than as described in the HPI.     Objective:   Physical Exam Filed Vitals:   01/19/14 1354  BP: 150/80  Pulse: 100  Temp: 98.5 F (36.9 C)   Gen'l- WNWD no acute distres HEENT-  tender to percussion over the frontal sinuses Cor - RRR Pulm - normal respirations, Clear to A&P w/o wheezing Neuro - alert and oriented.        Assessment & Plan:  Seasonal flair of allergic symptoms and probable infection.  Plan Azithromycin 500 mg qd x 3  Nasonex 1 spray to each nostril daily  Saline nasal spray  Tylenol as needed for fever

## 2014-01-19 NOTE — Progress Notes (Signed)
Pre visit review using our clinic review tool, if applicable. No additional management support is needed unless otherwise documented below in the visit note. 

## 2014-01-20 ENCOUNTER — Telehealth: Payer: Self-pay | Admitting: Internal Medicine

## 2014-01-20 NOTE — Telephone Encounter (Signed)
Relevant patient education mailed to patient.  

## 2014-02-02 DIAGNOSIS — G5600 Carpal tunnel syndrome, unspecified upper limb: Secondary | ICD-10-CM | POA: Diagnosis not present

## 2014-02-08 ENCOUNTER — Other Ambulatory Visit: Payer: Self-pay

## 2014-02-08 MED ORDER — OMEPRAZOLE 20 MG PO CPDR: 20.0000 mg | DELAYED_RELEASE_CAPSULE | Freq: Every day | ORAL | Status: DC

## 2014-02-08 MED ORDER — MELOXICAM 15 MG PO TABS: 15.0000 mg | ORAL_TABLET | Freq: Every day | ORAL | Status: DC

## 2014-02-08 NOTE — Telephone Encounter (Signed)
Dr Debby BudNorins patient Last ov 01/19/14 Med last filled 04/12/13 #30 plus 5 refills

## 2014-03-30 DIAGNOSIS — G5600 Carpal tunnel syndrome, unspecified upper limb: Secondary | ICD-10-CM | POA: Diagnosis not present

## 2014-06-01 ENCOUNTER — Encounter: Payer: Self-pay | Admitting: Internal Medicine

## 2014-06-01 ENCOUNTER — Ambulatory Visit (INDEPENDENT_AMBULATORY_CARE_PROVIDER_SITE_OTHER): Payer: Medicare Other | Admitting: Internal Medicine

## 2014-06-01 VITALS — BP 140/90 | HR 87 | Temp 98.5°F | Wt 173.5 lb

## 2014-06-01 DIAGNOSIS — N320 Bladder-neck obstruction: Secondary | ICD-10-CM

## 2014-06-01 DIAGNOSIS — I10 Essential (primary) hypertension: Secondary | ICD-10-CM

## 2014-06-01 DIAGNOSIS — J309 Allergic rhinitis, unspecified: Secondary | ICD-10-CM | POA: Diagnosis not present

## 2014-06-01 DIAGNOSIS — Z0000 Encounter for general adult medical examination without abnormal findings: Secondary | ICD-10-CM | POA: Diagnosis not present

## 2014-06-01 DIAGNOSIS — F411 Generalized anxiety disorder: Secondary | ICD-10-CM

## 2014-06-01 MED ORDER — TAMSULOSIN HCL 0.4 MG PO CAPS: 0.4000 mg | ORAL_CAPSULE | Freq: Every day | ORAL | Status: DC

## 2014-06-01 MED ORDER — OMEPRAZOLE 20 MG PO CPDR: 20.0000 mg | DELAYED_RELEASE_CAPSULE | Freq: Every day | ORAL | Status: DC

## 2014-06-01 MED ORDER — METHYLPREDNISOLONE ACETATE 80 MG/ML IJ SUSP
120.0000 mg | Freq: Once | INTRAMUSCULAR | Status: AC
  Administered 2014-06-01: 120 mg via INTRAMUSCULAR

## 2014-06-01 MED ORDER — MELOXICAM 15 MG PO TABS: 15.0000 mg | ORAL_TABLET | Freq: Every day | ORAL | Status: DC

## 2014-06-01 NOTE — Progress Notes (Signed)
Pre visit review using our clinic review tool, if applicable. No additional management support is needed unless otherwise documented below in the visit note. 

## 2014-06-01 NOTE — Assessment & Plan Note (Signed)
stable overall by history and exam,  and pt to continue medical treatment as before,  to f/u any worsening symptoms or concerns, declines referral for counseling, cont current tx

## 2014-06-01 NOTE — Assessment & Plan Note (Addendum)
stable overall by history and exam, recent data reviewed with pt, and pt to continue medical treatment as before,  to f/u any worsening symptoms or concerns BP Readings from Last 3 Encounters:  06/01/14 140/90  01/19/14 150/80  12/08/13 150/84   For f/u yearly labs

## 2014-06-01 NOTE — Progress Notes (Signed)
Subjective:    Patient ID: Phillip Mullen, male    DOB: May 20, 1966, 48 y.o.   MRN: [id number]  HPI Here for yearly f/u, former pt of Dr Debby BudNorins,   Overall doing ok;  Pt denies CP, worsening SOB, DOE, wheezing, orthopnea, PND, worsening LE edema, palpitations, dizziness or syncope.  Pt denies neurological change such as new headache, facial or extremity weakness.  Pt denies polydipsia, polyuria, or low sugar symptoms. Pt states overall good compliance with treatment and medications, good tolerability, and has been trying to follow lower cholesterol diet.  Pt denies worsening depressive symptoms, suicidal ideation or panic. No fever, night sweats, wt loss, loss of appetite, or other constitutional symptoms.  Pt states good ability with ADL's, has low fall risk, home safety reviewed and adequate, no other significant changes in hearing or vision, and only occasionally active with exercise.  Does mention that has what sounds like retrograde ejaculation, really has him worried. Denies urinary symptoms such as dysuria, frequency, urgency, flank pain, hematuria or n/v, fever, chills. Has nocturia 0-1 times per night. Pt continues to have recurring LBP without change in severity, bowel or bladder change, fever, wt loss,  worsening LE pain/numbness/weakness, gait change or falls. Also sees Dr Norris/orhto for left shoulder sp cortisone, now right shoulder seems to be hurting as well. Does have several wks ongoing nasal allergy symptoms with clearish congestion, itch and sneezing, without fever, pain, ST, cough, swelling or wheezing. Past Medical History  Diagnosis Date  . Anxiety   . GERD (gastroesophageal reflux disease)   . Headache(784.0)   . Hypertension   . Osteoarthritis   . Bilateral carpal tunnel syndrome 02/10/2013    Per Dr Amanda PeaGramig   Past Surgical History  Procedure Laterality Date  . Inguinal hernia repair    . Tonsillectomy    . Abcess drainage      Infected finger    reports that he has  been smoking Cigarettes.  He has been smoking about 1.00 pack per day. He does not have any smokeless tobacco history on file. He reports that he drinks alcohol. He reports that he does not use illicit drugs. family history is not on file. Allergies  Allergen Reactions  . Penicillins     REACTION: hives  . Sulfonamide Derivatives    Current Outpatient Prescriptions on File Prior to Visit  Medication Sig Dispense Refill  . ALPRAZolam (XANAX) 1 MG tablet Take 1 tablet (1 mg total) by mouth 3 (three) times daily as needed.  90 tablet  5  . aspirin 81 MG tablet Take 81 mg by mouth daily.        . cetirizine (ZYRTEC) 10 MG tablet Take 1 tablet (10 mg total) by mouth daily.  30 tablet  12  . cyclobenzaprine (FLEXERIL) 10 MG tablet Take 1 tablet (10 mg total) by mouth 3 (three) times daily as needed.  90 tablet  2  . fluticasone (FLONASE) 50 MCG/ACT nasal spray Place 2 sprays into both nostrils daily.  16 g  5  . magnesium hydroxide (MILK OF MAGNESIA) 800 MG/5ML suspension Take 15 mLs by mouth daily as needed.        . meloxicam (MOBIC) 15 MG tablet Take 1 tablet (15 mg total) by mouth daily. PATIENT WILL NEED TO EST NEW PCP FOR FURTHER REFILLS SINCE DR MICHAEL NORINS RETIRED  30 tablet  0  . omeprazole (PRILOSEC) 20 MG capsule Take 1 capsule (20 mg total) by mouth daily. PATIENT NEEDS TO EST  NEW PCP FOR FURTHER REFILLS SINCE DR MICHAEL NORINS HAS RETIRED  30 capsule  1  . oxyCODONE (OXY IR/ROXICODONE) 5 MG immediate release tablet Take 1 tablet (5 mg total) by mouth every 4 (four) hours as needed. Fill on or after 06/10/14.  120 tablet  0  . OxyCODONE (OXYCONTIN) 40 mg T12A 12 hr tablet Take 1 tablet (40 mg total) by mouth 3 (three) times daily. To fill on or after 06/10/2014  90 tablet  0   No current facility-administered medications on file prior to visit.   Review of Systems Constitutional: Negative for increased diaphoresis, other activity, appetite or other siginficant weight change  HENT:  Negative for worsening hearing loss, ear pain, facial swelling, mouth sores and neck stiffness.   Eyes: Negative for other worsening pain, redness or visual disturbance.  Respiratory: Negative for shortness of breath and wheezing.   Cardiovascular: Negative for chest pain and palpitations.  Gastrointestinal: Negative for diarrhea, blood in stool, abdominal distention or other pain Genitourinary: Negative for hematuria, flank pain or change in urine volume.  Musculoskeletal: Negative for myalgias or other joint complaints.  Skin: Negative for color change and wound.  Neurological: Negative for syncope and numbness. other than noted Hematological: Negative for adenopathy. or other swelling Psychiatric/Behavioral: Negative for hallucinations, self-injury, decreased concentration or other worsening agitation.      Objective:   Physical Exam BP 140/90  Pulse 87  Temp(Src) 98.5 F (36.9 C) (Oral)  Wt 173 lb 8 oz (78.699 kg)  SpO2 95% VS noted, not ill appearing, wearing back brace Constitutional: Pt is oriented to person, place, and time. Appears well-developed and well-nourished.  Head: Normocephalic and atraumatic.  Right Ear: External ear normal.  Left Ear: External ear normal.  Nose: Nose normal.  Mouth/Throat: Oropharynx is clear and moist.  Bilat tm's with mild erythema.  Max sinus areas non tender.  Pharynx with mild erythema, no exudate Eyes: Conjunctivae and EOM are normal. Pupils are equal, round, and reactive to light.  Neck: Normal range of motion. Neck supple. No JVD present. No tracheal deviation present.  Cardiovascular: Normal rate, regular rhythm, normal heart sounds and intact distal pulses.   Pulmonary/Chest: Effort normal and breath sounds without rales or wheezing  Abdominal: Soft. Bowel sounds are normal. NT. No HSM  Musculoskeletal: Normal range of motion. Exhibits no edema.  Lymphadenopathy:  Has no cervical adenopathy.  Neurological: Pt is alert and oriented to  person, place, and time. Pt has normal reflexes. No cranial nerve deficit. Motor grossly intact Skin: Skin is warm and dry. No rash noted.  Psychiatric:  Has markedly nervous mood and affect. Behavior is normal.     Assessment & Plan:

## 2014-06-01 NOTE — Patient Instructions (Addendum)
You had the steroid shot today  Please continue all other medications as before, and refills have been done if requested.  Please have the pharmacy call with any other refills you may need.  Please continue your efforts at being more active, low cholesterol diet, and weight control.  You are otherwise up to date with prevention measures today.  Please keep your appointments with your specialists as you may have planned  You will be contacted regarding the referral for: colonoscopy  Please go to the LAB in the Basement (turn left off the elevator) for the tests to be done tomorrow  You will be contacted by phone if any changes need to be made immediately.  Otherwise, you will receive a letter about your results with an explanation, but please check with MyChart first.  Please remember to sign up for MyChart if you have not done so, as this will be important to you in the future with finding out test results, communicating by private email, and scheduling acute appointments online when needed.  Please return in 6 months, or sooner if needed

## 2014-06-01 NOTE — Assessment & Plan Note (Signed)
Also for psa as he is due, asympt 

## 2014-06-01 NOTE — Assessment & Plan Note (Signed)
With flare - for depomedrol IM,  to f/u any worsening symptoms or concerns

## 2014-06-02 ENCOUNTER — Encounter: Payer: Self-pay | Admitting: Internal Medicine

## 2014-06-02 ENCOUNTER — Other Ambulatory Visit (INDEPENDENT_AMBULATORY_CARE_PROVIDER_SITE_OTHER): Payer: Medicare Other

## 2014-06-02 ENCOUNTER — Other Ambulatory Visit: Payer: Self-pay | Admitting: Internal Medicine

## 2014-06-02 DIAGNOSIS — I10 Essential (primary) hypertension: Secondary | ICD-10-CM

## 2014-06-02 DIAGNOSIS — N320 Bladder-neck obstruction: Secondary | ICD-10-CM

## 2014-06-02 LAB — CBC WITH DIFFERENTIAL/PLATELET
Basophils Absolute: 0.1 10*3/uL (ref 0.0–0.1)
Basophils Relative: 0.6 % (ref 0.0–3.0)
EOS ABS: 0.2 10*3/uL (ref 0.0–0.7)
Eosinophils Relative: 2.5 % (ref 0.0–5.0)
HCT: 41 % (ref 39.0–52.0)
HEMOGLOBIN: 13.8 g/dL (ref 13.0–17.0)
LYMPHS PCT: 29 % (ref 12.0–46.0)
Lymphs Abs: 2.9 10*3/uL (ref 0.7–4.0)
MCHC: 33.5 g/dL (ref 30.0–36.0)
MCV: 88.6 fl (ref 78.0–100.0)
MONOS PCT: 6.7 % (ref 3.0–12.0)
Monocytes Absolute: 0.7 10*3/uL (ref 0.1–1.0)
NEUTROS ABS: 6.1 10*3/uL (ref 1.4–7.7)
NEUTROS PCT: 61.2 % (ref 43.0–77.0)
PLATELETS: 225 10*3/uL (ref 150.0–400.0)
RBC: 4.63 Mil/uL (ref 4.22–5.81)
RDW: 14.1 % (ref 11.5–15.5)
WBC: 9.9 10*3/uL (ref 4.0–10.5)

## 2014-06-02 LAB — LDL CHOLESTEROL, DIRECT: Direct LDL: 147.3 mg/dL

## 2014-06-02 LAB — LIPID PANEL
CHOLESTEROL: 201 mg/dL — AB (ref 0–200)
HDL: 38.3 mg/dL — ABNORMAL LOW (ref >39.00)
NonHDL: 162.7
TRIGLYCERIDES: 259 mg/dL — AB (ref 0.0–149.0)
Total CHOL/HDL Ratio: 5
VLDL: 51.8 mg/dL — AB (ref 0.0–40.0)

## 2014-06-02 LAB — BASIC METABOLIC PANEL
BUN: 17 mg/dL (ref 6–23)
CHLORIDE: 100 meq/L (ref 96–112)
CO2: 29 mEq/L (ref 19–32)
CREATININE: 0.8 mg/dL (ref 0.4–1.5)
Calcium: 9.2 mg/dL (ref 8.4–10.5)
GFR: 105.69 mL/min (ref >60.00)
Glucose, Bld: 94 mg/dL (ref 70–99)
Potassium: 3.6 mEq/L (ref 3.5–5.1)
Sodium: 137 mEq/L (ref 135–145)

## 2014-06-02 LAB — URINALYSIS, ROUTINE W REFLEX MICROSCOPIC
Bilirubin Urine: NEGATIVE
Hgb urine dipstick: NEGATIVE
Ketones, ur: NEGATIVE
LEUKOCYTES UA: NEGATIVE
NITRITE: NEGATIVE
RBC / HPF: NONE SEEN (ref 0–?)
SPECIFIC GRAVITY, URINE: 1.025 (ref 1.000–1.030)
Total Protein, Urine: NEGATIVE
Urine Glucose: NEGATIVE
Urobilinogen, UA: 0.2 (ref 0.0–1.0)
WBC UA: NONE SEEN (ref 0–?)
pH: 6 (ref 5.0–8.0)

## 2014-06-02 LAB — HEPATIC FUNCTION PANEL
ALBUMIN: 4.1 g/dL (ref 3.5–5.2)
ALT: 21 U/L (ref 0–53)
AST: 17 U/L (ref 0–37)
Alkaline Phosphatase: 46 U/L (ref 39–117)
BILIRUBIN TOTAL: 0.6 mg/dL (ref 0.2–1.2)
Bilirubin, Direct: 0.1 mg/dL (ref 0.0–0.3)
Total Protein: 6.4 g/dL (ref 6.0–8.3)

## 2014-06-02 LAB — PSA: PSA: 0.36 ng/mL (ref 0.10–4.00)

## 2014-06-02 LAB — TSH: TSH: 2.28 u[IU]/mL (ref 0.35–4.50)

## 2014-06-02 MED ORDER — ATORVASTATIN CALCIUM 10 MG PO TABS: 10.0000 mg | ORAL_TABLET | Freq: Every day | ORAL | Status: DC

## 2014-06-05 ENCOUNTER — Encounter: Payer: Self-pay | Admitting: Internal Medicine

## 2014-06-21 DIAGNOSIS — M25819 Other specified joint disorders, unspecified shoulder: Secondary | ICD-10-CM | POA: Diagnosis not present

## 2014-07-04 ENCOUNTER — Other Ambulatory Visit: Payer: Self-pay

## 2014-07-04 DIAGNOSIS — M549 Dorsalgia, unspecified: Secondary | ICD-10-CM

## 2014-07-04 MED ORDER — ALPRAZOLAM 1 MG PO TABS: 1.0000 mg | ORAL_TABLET | Freq: Three times a day (TID) | ORAL | Status: DC | PRN

## 2014-07-04 NOTE — Telephone Encounter (Signed)
Done hardcopy to robin  

## 2014-07-05 NOTE — Telephone Encounter (Signed)
Faxed hardcopy for Alprazolam to [facility]

## 2014-07-10 ENCOUNTER — Telehealth: Payer: Self-pay | Admitting: Internal Medicine

## 2014-07-10 DIAGNOSIS — M549 Dorsalgia, unspecified: Secondary | ICD-10-CM

## 2014-07-10 NOTE — Telephone Encounter (Signed)
Patient will be out today.  He is requesting another provider to fill since Dr. Jonny Ruiz is not in today.

## 2014-07-10 NOTE — Telephone Encounter (Signed)
Pt has 2 narcotic medications  Need to clarify which he refers to  Also apparently there may have been some miscommunication or misunderstanding at his last visit;  Despite what Dr Debby Bud has done in the past, I do not practice chronic pain management, and do not prescribe schedule II or higher medications for chronic pain  Due to the misunderstanding, I may be able to refill his current med for a brieft time, but Does pt need pain clinic referral?

## 2014-07-10 NOTE — Telephone Encounter (Signed)
Patient is requesting script for oxycodone °

## 2014-07-10 NOTE — Telephone Encounter (Signed)
There is a 24-48 hour turn around for refills. Per chart pain med not due until 07/11/14 will forward to pcp to address tomorrow...Raechel Chute

## 2014-07-11 ENCOUNTER — Telehealth: Payer: Self-pay | Admitting: Internal Medicine

## 2014-07-11 MED ORDER — OXYCODONE HCL 5 MG PO TABS: 5.0000 mg | ORAL_TABLET | ORAL | Status: DC | PRN

## 2014-07-11 MED ORDER — OXYCODONE HCL ER 40 MG PO T12A: 40.0000 mg | EXTENDED_RELEASE_TABLET | Freq: Three times a day (TID) | ORAL | Status: DC

## 2014-07-11 NOTE — Telephone Encounter (Signed)
Ok for 67mo of both rx  I cannot prescribed further after that  I will refer to pain clinic, or pt can establish with a PCP who does chronic pain management on his/her own. thanks

## 2014-07-11 NOTE — Telephone Encounter (Signed)
Notified pt gave him md response. He stated he need both oxycodone & oxycontin. Pt also stated that md didn't tell him that at the office visit. Will need a referral to pain clinic...Raechel Chute

## 2014-07-11 NOTE — Telephone Encounter (Signed)
Patient can also be reached at [phone].  Patient is requesting referral for pain management to be sent to [facility].  Dr. Shireen Quan.

## 2014-07-11 NOTE — Telephone Encounter (Signed)
Referral was entered today, 07/11/14. We will process from work queue.

## 2014-07-11 NOTE — Telephone Encounter (Signed)
Notified pt rx's ready for pick-up../lmb 

## 2014-07-20 ENCOUNTER — Ambulatory Visit (AMBULATORY_SURGERY_CENTER): Payer: Self-pay

## 2014-07-20 VITALS — Ht 65.0 in | Wt 177.4 lb

## 2014-07-20 DIAGNOSIS — Z1211 Encounter for screening for malignant neoplasm of colon: Secondary | ICD-10-CM

## 2014-07-20 MED ORDER — MOVIPREP 100 G PO SOLR: 1.0000 | Freq: Once | ORAL | Status: DC

## 2014-07-20 NOTE — Progress Notes (Signed)
No allergies to eggs or soy No home oxygen No diet/weight loss meds No past problems with anesthesia  No email No internet access

## 2014-08-03 ENCOUNTER — Ambulatory Visit (AMBULATORY_SURGERY_CENTER): Payer: Medicare Other | Admitting: Internal Medicine

## 2014-08-03 ENCOUNTER — Encounter: Payer: Self-pay | Admitting: Internal Medicine

## 2014-08-03 VITALS — BP 133/93 | HR 63 | Temp 98.0°F | Resp 18 | Ht 65.0 in | Wt 177.0 lb

## 2014-08-03 DIAGNOSIS — Z1211 Encounter for screening for malignant neoplasm of colon: Secondary | ICD-10-CM

## 2014-08-03 DIAGNOSIS — I10 Essential (primary) hypertension: Secondary | ICD-10-CM | POA: Diagnosis not present

## 2014-08-03 DIAGNOSIS — R569 Unspecified convulsions: Secondary | ICD-10-CM | POA: Diagnosis not present

## 2014-08-03 MED ORDER — SODIUM CHLORIDE 0.9 % IV SOLN: 500.0000 mL | INTRAVENOUS | Status: DC

## 2014-08-03 NOTE — Progress Notes (Signed)
A/ox3 pleased with MAC, report to Sheila RN 

## 2014-08-03 NOTE — Op Note (Signed)
[facility] [address] [place], [id number]   COLONOSCOPY PROCEDURE REPORT  PATIENT: Phillip FilesGerringer, Meldon C  MR#: [id number] BIRTHDATE: 20-May-1966 , 48  yrs. old GENDER: male ENDOSCOPIST: Roxy CedarJohn N Marvene Strohm Jr, MD REFERRED NW:GNFAOBY:James Henson Fraticelli, M.D. PROCEDURE DATE:  08/03/2014 PROCEDURE:   Colonoscopy, screening First Screening Colonoscopy - Avg.  risk and is 50 yrs.  old or older Yes.  Prior Negative Screening - Now for repeat screening. N/A  History of Adenoma - Now for follow-up colonoscopy & has been > or = to 3 yrs.  N/A  Polyps Removed Today? No.  Recommend repeat exam, <10 yrs? Polyps Removed Today? No.  Recommend repeat exam, <10 yrs? No. ASA CLASS:   Class II INDICATIONS:average risk for colorectal cancer. MEDICATIONS: Monitored anesthesia care and Propofol 250 mg IV  DESCRIPTION OF PROCEDURE:   After the risks benefits and alternatives of the procedure were thoroughly explained, informed consent was obtained.  The digital rectal exam revealed no abnormalities of the rectum.   The LB [id number]  endoscope was introduced through the anus and advanced to the cecum, which was identified by both the appendix and ileocecal valve. No adverse events experienced.   The quality of the prep was excellent, using MoviPrep  The instrument was then slowly withdrawn as the colon was fully examined.      COLON FINDINGS: A normal appearing cecum, ileocecal valve, and appendiceal orifice were identified.  The ascending, transverse, descending, sigmoid colon, and rectum appeared unremarkable. Retroflexed views revealed internal hemorrhoids. The time to cecum=2 minutes 35 seconds.  Withdrawal time=9 minutes 56 seconds. The scope was withdrawn and the procedure completed.  COMPLICATIONS: There were no immediate complications.  ENDOSCOPIC IMPRESSION: Normal colonoscopy  RECOMMENDATIONS: Continue current colorectal screening recommendations for "routine risk" patients  with a repeat colonoscopy in 10 years.  eSigned:  Roxy CedarJohn N Kelita Wallis Jr, MD 08/03/2014 10:34 AM   cc: Corwin LevinsJames W Britini Garcilazo, MD    ; [place]ley [facility] Patient

## 2014-08-03 NOTE — Patient Instructions (Signed)
YOU HAD AN ENDOSCOPIC PROCEDURE TODAY AT THE [facility]: Refer to the procedure report that was given to you for any specific questions about what was found during the examination.  If the procedure report does not answer your questions, please call your gastroenterologist to clarify.  If you requested that your care partner not be given the details of your procedure findings, then the procedure report has been included in a sealed envelope for you to review at your convenience later.  YOU SHOULD EXPECT: Some feelings of bloating in the abdomen. Passage of more gas than usual.  Walking can help get rid of the air that was put into your GI tract during the procedure and reduce the bloating. If you had a lower endoscopy (such as a colonoscopy or flexible sigmoidoscopy) you may notice spotting of blood in your stool or on the toilet paper. If you underwent a bowel prep for your procedure, then you may not have a normal bowel movement for a few days.  DIET: Your first meal following the procedure should be a light meal and then it is ok to progress to your normal diet.  A half-sandwich or bowl of soup is an example of a good first meal.  Heavy or fried foods are harder to digest and may make you feel nauseous or bloated.  Likewise meals heavy in dairy and vegetables can cause extra gas to form and this can also increase the bloating.  Drink plenty of fluids but you should avoid alcoholic beverages for 24 hours.  ACTIVITY: Your care partner should take you home directly after the procedure.  You should plan to take it easy, moving slowly for the rest of the day.  You can resume normal activity the day after the procedure however you should NOT DRIVE or use heavy machinery for 24 hours (because of the sedation medicines used during the test).    SYMPTOMS TO REPORT IMMEDIATELY: A gastroenterologist can be reached at any hour.  During normal business hours, 8:30 AM to 5:00 PM Monday through Friday,  call [phone].  After hours and on weekends, please call the GI answering service at [phone] who will take a message and have the physician on call contact you.   Following lower endoscopy (colonoscopy or flexible sigmoidoscopy):  Excessive amounts of blood in the stool  Significant tenderness or worsening of abdominal pains  Swelling of the abdomen that is new, acute  Fever of 100F or higher  FOLLOW UP: If any biopsies were taken you will be contacted by phone or by letter within the next 1-3 weeks.  Call your gastroenterologist if you have not heard about the biopsies in 3 weeks.  Our staff will call the home number listed on your records the next business day following your procedure to check on you and address any questions or concerns that you may have at that time regarding the information given to you following your procedure. This is a courtesy call and so if there is no answer at the home number and we have not heard from you through the emergency physician on call, we will assume that you have returned to your regular daily activities without incident.  SIGNATURES/CONFIDENTIALITY: You and/or your care partner have signed paperwork which will be entered into your electronic medical record.  These signatures attest to the fact that that the information above on your After Visit Summary has been reviewed and is understood.  Full responsibility of the confidentiality of this   discharge information lies with you and/or your care-partner.  Resume medications. 

## 2014-08-04 ENCOUNTER — Telehealth: Payer: Self-pay

## 2014-08-04 NOTE — Telephone Encounter (Signed)
Left message on answering machine. 

## 2014-08-21 ENCOUNTER — Telehealth: Payer: Self-pay | Admitting: Internal Medicine

## 2014-08-21 NOTE — Telephone Encounter (Signed)
Pt came by today asking about status of getting him into a pain clinic.

## 2014-08-22 NOTE — Telephone Encounter (Signed)
Patient informed [facility] and awaiting appointment.

## 2014-08-31 ENCOUNTER — Telehealth: Payer: Self-pay

## 2014-08-31 MED ORDER — MOMETASONE FUROATE 50 MCG/ACT NA SUSP: 2.0000 | Freq: Every day | NASAL | Status: DC

## 2014-08-31 NOTE — Telephone Encounter (Signed)
This was done erx 

## 2014-08-31 NOTE — Telephone Encounter (Signed)
[facility] the patient would like to change from Fluticasone to Nasonex.  [facility]

## 2014-09-06 ENCOUNTER — Telehealth: Payer: Self-pay | Admitting: Internal Medicine

## 2014-09-06 DIAGNOSIS — G894 Chronic pain syndrome: Secondary | ICD-10-CM

## 2014-09-06 NOTE — Telephone Encounter (Signed)
Patient called requesting rx for oxycodone and oxycontin. He states he will be out on Sunday and would like to pick this up on Friday or Saturday. CB# [phone]. Okay to leave msg on voicemail. In reference to the patient's pain management referral, [facility] declined to see the patient. We will be sending his referral to another pain clinic.

## 2014-09-06 NOTE — Telephone Encounter (Signed)
No, needs OV to establish with new PCP as noted at his last courtesy refill.  Please note that I do not practice chronic pain medicine, and will not be able to do ongoing refills long term

## 2014-09-07 MED ORDER — OXYCODONE HCL 5 MG PO TABS: 5.0000 mg | ORAL_TABLET | ORAL | Status: DC | PRN

## 2014-09-07 MED ORDER — OXYCODONE HCL ER 40 MG PO T12A: 40.0000 mg | EXTENDED_RELEASE_TABLET | Freq: Three times a day (TID) | ORAL | Status: DC

## 2014-09-07 NOTE — Telephone Encounter (Signed)
Please evaluate since seeing this patient in August 2015.

## 2014-09-07 NOTE — Telephone Encounter (Signed)
Called left message to call back 

## 2014-09-07 NOTE — Telephone Encounter (Signed)
The note was made on the prescription itself, which was done one time out of courtesy  Regrettably, I will not be able to provide further rx, and am not currently his PCP.  Pt needs to establish with a new PCP since Dr Debby BudNorins retired.   I have never actually seen this pt in the office as a pt to my knowledge, and I would not be able to do this solely for the purpose of further pain prescriptions, since I no longer practice chronic pain treatment  I am not responsible for the time it takes to get referrals accomplished, so cannot help there either

## 2014-09-07 NOTE — Telephone Encounter (Signed)
Dr. Jonny RuizJohn, Patient's referral has been sent to [facility] (per the patient's request). They state it could be 3-6 months before the patient is scheduled. Patient saw you in August and was told to return in 6 months per his AVS. I do see your note in the last refill, but there is no documentation in the note that the patient was made aware it was his last refill or that he needed a new PCP. Just wanted to make you aware before I call the patient to tell him we are not refilling his pain meds. Please advise.

## 2014-09-07 NOTE — Telephone Encounter (Signed)
Very sorry, I completely overlooked his last visit with me in Aug 2015.    OK for pain med refills until able to be seen per pain clinic  Done hardcopy to robin

## 2014-09-08 ENCOUNTER — Ambulatory Visit (INDEPENDENT_AMBULATORY_CARE_PROVIDER_SITE_OTHER): Payer: Medicare Other | Admitting: Geriatric Medicine

## 2014-09-08 ENCOUNTER — Ambulatory Visit: Payer: Medicare Other

## 2014-09-08 DIAGNOSIS — Z23 Encounter for immunization: Secondary | ICD-10-CM | POA: Diagnosis not present

## 2014-09-08 NOTE — Telephone Encounter (Signed)
Patient informed to pickup hardcopy of both medication refills at the front desk.

## 2014-09-08 NOTE — Telephone Encounter (Signed)
Called the patient left a detailed message hardcopy is ready for pickup and to call back to confirm message received.

## 2014-09-08 NOTE — Telephone Encounter (Signed)
Called left message to call back 

## 2014-09-12 ENCOUNTER — Ambulatory Visit: Payer: Medicare Other

## 2014-09-12 DIAGNOSIS — M7542 Impingement syndrome of left shoulder: Secondary | ICD-10-CM | POA: Diagnosis not present

## 2014-09-12 DIAGNOSIS — M7541 Impingement syndrome of right shoulder: Secondary | ICD-10-CM | POA: Diagnosis not present

## 2014-09-15 ENCOUNTER — Telehealth: Payer: Self-pay

## 2014-09-15 DIAGNOSIS — G894 Chronic pain syndrome: Secondary | ICD-10-CM

## 2014-09-15 NOTE — Telephone Encounter (Signed)
Referral done

## 2014-09-15 NOTE — Telephone Encounter (Signed)
Pt came into the office today and is requesting a referral to Dr. Shireen QuanNaviera with [facility].  Pt stated that they were informed that they just opened 3 new offices in [place], [place], [place]. Maybe this iwll be a great thing.   Fax Referral to [phone]

## 2014-09-15 NOTE — Telephone Encounter (Signed)
LVM with MD response "this has been completed" and to call back if there are any questions.

## 2014-10-06 ENCOUNTER — Other Ambulatory Visit: Payer: Self-pay

## 2014-10-06 DIAGNOSIS — M791 Myalgia: Secondary | ICD-10-CM | POA: Diagnosis not present

## 2014-10-06 DIAGNOSIS — M47816 Spondylosis without myelopathy or radiculopathy, lumbar region: Secondary | ICD-10-CM | POA: Diagnosis not present

## 2014-10-06 DIAGNOSIS — M5440 Lumbago with sciatica, unspecified side: Secondary | ICD-10-CM | POA: Diagnosis not present

## 2014-10-06 DIAGNOSIS — F419 Anxiety disorder, unspecified: Secondary | ICD-10-CM | POA: Diagnosis not present

## 2014-10-06 DIAGNOSIS — F17210 Nicotine dependence, cigarettes, uncomplicated: Secondary | ICD-10-CM | POA: Diagnosis not present

## 2014-10-06 DIAGNOSIS — Z79899 Other long term (current) drug therapy: Secondary | ICD-10-CM | POA: Diagnosis not present

## 2014-10-06 DIAGNOSIS — G8929 Other chronic pain: Secondary | ICD-10-CM | POA: Diagnosis not present

## 2014-10-06 DIAGNOSIS — M542 Cervicalgia: Secondary | ICD-10-CM | POA: Diagnosis not present

## 2014-10-06 DIAGNOSIS — Z7982 Long term (current) use of aspirin: Secondary | ICD-10-CM | POA: Diagnosis not present

## 2014-10-06 MED ORDER — CETIRIZINE HCL 10 MG PO TABS: 10.0000 mg | ORAL_TABLET | Freq: Every day | ORAL | Status: DC

## 2014-10-09 ENCOUNTER — Telehealth: Payer: Self-pay | Admitting: Internal Medicine

## 2014-10-09 DIAGNOSIS — G894 Chronic pain syndrome: Secondary | ICD-10-CM

## 2014-10-09 NOTE — Telephone Encounter (Signed)
Pt called in and said that he went to his consultation at the pain management clinic.  He wanted to know if Dr Jonny RuizJohn could fill his pain meds again?   Please advise

## 2014-10-10 NOTE — Telephone Encounter (Signed)
Very sorry, I do not practice chronic pain medicine, nor prescribe schedule II or higher pain medications on a long term basis

## 2014-10-10 NOTE — Telephone Encounter (Signed)
Called the patient and he stated he has gone to a pain clinic, but stated his next appt. With them is November 30, 2014 and he did not get any refills from pain clinic.

## 2014-10-10 NOTE — Telephone Encounter (Signed)
Pt called in and is very upset about response. Check with Zella BallRobin and verified Dr Jonny RuizJohn is not giving anymore pain meds.  He was yelling and crying and upset and said "what am I suppose to do, just dye"?  Explain to him that he has been sent to pain management.  He said that he cant wait 10 weeks.  Just continued to cry and yell.  He wanted me to check with Dr Jonny RuizJohn to see what he was suppose to do.

## 2014-10-10 NOTE — Telephone Encounter (Signed)
I am confused, as the first note stated he had been to his pain clinic  Perhaps pt should call there since he is established for refills?  Unless there is some policy at the clinic that no meds to be given first visit (as some have this policy) which would need to be verified by calling the clinic

## 2014-10-11 MED ORDER — OXYCODONE HCL ER 40 MG PO T12A: 40.0000 mg | EXTENDED_RELEASE_TABLET | Freq: Three times a day (TID) | ORAL | Status: DC

## 2014-10-11 MED ORDER — OXYCODONE HCL ER 40 MG PO T12A: 40.0000 mg | EXTENDED_RELEASE_TABLET | Freq: Three times a day (TID) | ORAL | Status: AC

## 2014-10-11 MED ORDER — OXYCODONE HCL 5 MG PO TABS: 5.0000 mg | ORAL_TABLET | ORAL | Status: AC | PRN

## 2014-10-11 MED ORDER — OXYCODONE HCL 5 MG PO TABS: 5.0000 mg | ORAL_TABLET | ORAL | Status: DC | PRN

## 2014-10-11 NOTE — Telephone Encounter (Signed)
Called the patient informed of MD's instructions on future refills and that PCP did refill pain medication as requested and can pickup at the front desk.

## 2014-10-11 NOTE — Telephone Encounter (Signed)
Will call the patient to inform refills done, but what to tell him for future refills?

## 2014-10-11 NOTE — Telephone Encounter (Signed)
As per note, pt has appt with pain clinic feb 6 (I think)  I would expect any future refills will need to be per pain clinic

## 2014-11-30 DIAGNOSIS — M79604 Pain in right leg: Secondary | ICD-10-CM | POA: Diagnosis not present

## 2014-11-30 DIAGNOSIS — G8929 Other chronic pain: Secondary | ICD-10-CM | POA: Diagnosis not present

## 2014-11-30 DIAGNOSIS — F419 Anxiety disorder, unspecified: Secondary | ICD-10-CM | POA: Diagnosis not present

## 2014-11-30 DIAGNOSIS — M791 Myalgia: Secondary | ICD-10-CM | POA: Diagnosis not present

## 2014-11-30 DIAGNOSIS — M79605 Pain in left leg: Secondary | ICD-10-CM | POA: Diagnosis not present

## 2014-11-30 DIAGNOSIS — M542 Cervicalgia: Secondary | ICD-10-CM | POA: Diagnosis not present

## 2014-11-30 DIAGNOSIS — M25511 Pain in right shoulder: Secondary | ICD-10-CM | POA: Diagnosis not present

## 2014-11-30 DIAGNOSIS — M25512 Pain in left shoulder: Secondary | ICD-10-CM | POA: Diagnosis not present

## 2014-11-30 DIAGNOSIS — M5440 Lumbago with sciatica, unspecified side: Secondary | ICD-10-CM | POA: Diagnosis not present

## 2014-11-30 DIAGNOSIS — R51 Headache: Secondary | ICD-10-CM | POA: Diagnosis not present

## 2014-11-30 DIAGNOSIS — Z79891 Long term (current) use of opiate analgesic: Secondary | ICD-10-CM | POA: Diagnosis not present

## 2014-12-25 ENCOUNTER — Encounter: Payer: Self-pay | Admitting: Family

## 2014-12-25 ENCOUNTER — Ambulatory Visit (INDEPENDENT_AMBULATORY_CARE_PROVIDER_SITE_OTHER): Payer: Medicare Other | Admitting: Family

## 2014-12-25 VITALS — BP 160/96 | HR 100 | Temp 98.1°F | Resp 18 | Ht 66.0 in | Wt 173.0 lb

## 2014-12-25 DIAGNOSIS — R21 Rash and other nonspecific skin eruption: Secondary | ICD-10-CM | POA: Diagnosis not present

## 2014-12-25 MED ORDER — DOXYCYCLINE HYCLATE 100 MG PO TABS: 100.0000 mg | ORAL_TABLET | Freq: Two times a day (BID) | ORAL | Status: DC

## 2014-12-25 NOTE — Patient Instructions (Addendum)
Thank you for choosing [facility].  Summary/Instructions:  Your prescription(s) have been submitted to your pharmacy or been printed and provided for you. Please take as directed and contact our office if you believe you are having problem(s) with the medication(s) or have any questions.  If your symptoms worsen or fail to improve, please contact our office for further instruction, or in case of emergency go directly to the emergency room at the closest medical facility.   Keep wound clean and dry.   Please take your antibiotic as prescribed.

## 2014-12-25 NOTE — Progress Notes (Signed)
Pre visit review using our clinic review tool, if applicable. No additional management support is needed unless otherwise documented below in the visit note. 

## 2014-12-25 NOTE — Progress Notes (Signed)
Subjective:    Patient ID: Phillip Mullen, male    DOB: 06-23-1966, 49 y.o.   MRN: [id number]  Chief Complaint  Patient presents with  . Insect Bite    says that he thinks he got bit by a spider x6 days ago, the place is right below his underwear line, says its red and irritated and pain shoots to left testicle    HPI:  Phillip Filesnthony C Proehl is a 49 y.o. male who presents today for an acute visit.  This is a new problem. Associated symptoms of a red, irritated area located around his waist has been going on for about 6 days. Indicates that he believes it may have been a spider bite. States he was itching and noticed a bright red spot that had a white center and became red. Has tried placing alcohol and triple antibiotic which has not really helped. Notes that he has some radiating pain to his left testicle.                                                                                                                                                                                                                                                  Allergies  Allergen Reactions  . Penicillins     REACTION: hives  . Sulfonamide Derivatives     Pt not sure of the reaction    Current Outpatient Prescriptions on File Prior to Visit  Medication Sig Dispense Refill  . ALPRAZolam (XANAX) 1 MG tablet Take 1 tablet (1 mg total) by mouth 3 (three) times daily as needed. 90 tablet 5  . aspirin 81 MG tablet Take 81 mg by mouth daily.      Marland Kitchen. atorvastatin (LIPITOR) 10 MG tablet     . cetirizine (ZYRTEC) 10 MG tablet Take 1 tablet (10 mg total) by mouth daily. 30 tablet 5  . magnesium hydroxide (MILK OF MAGNESIA) 800 MG/5ML suspension Take 15 mLs by mouth daily as needed.      . meloxicam (MOBIC) 15 MG tablet Take 1 tablet (15 mg total) by mouth daily. PATIENT WILL NEED TO EST NEW PCP FOR FURTHER REFILLS SINCE DR MICHAEL NORINS RETIRED 90 tablet 3  . mometasone (NASONEX) 50 MCG/ACT nasal spray Place  2 sprays into the nose daily. 17 g 5  . omeprazole (PRILOSEC) 20 MG capsule  Take 1 capsule (20 mg total) by mouth daily. PATIENT NEEDS TO EST NEW PCP FOR FURTHER REFILLS SINCE DR MICHAEL NORINS HAS RETIRED 90 capsule 3  . oxyCODONE (OXY IR/ROXICODONE) 5 MG immediate release tablet Take 1 tablet (5 mg total) by mouth every 4 (four) hours as needed. Fill on or after 11/10/14 120 tablet 0  . OxyCODONE (OXYCONTIN) 40 mg T12A 12 hr tablet Take 1 tablet (40 mg total) by mouth 3 (three) times daily. To fill on or after 11/10/2014 90 tablet 0  . tamsulosin (FLOMAX) 0.4 MG CAPS capsule Take 1 capsule (0.4 mg total) by mouth daily. 30 capsule 5   No current facility-administered medications on file prior to visit.                                                                                                                                                                                  Review of Systems  Constitutional: Negative for fever and chills.  Skin: Positive for wound.      Objective:    BP 160/96 mmHg  Pulse 100  Temp(Src) 98.1 F (36.7 C) (Oral)  Resp 18  Ht  (1.676 m)  Wt 173 lb (78.472 kg)  BMI 27.94 kg/m2  SpO2 96% Nursing note and vital signs reviewed.  Physical Exam  Constitutional: He is oriented to person, place, and time. He appears well-developed and well-nourished. No distress.  Cardiovascular: Normal rate, regular rhythm, normal heart sounds and intact distal pulses.   Pulmonary/Chest: Effort normal and breath sounds normal.  Neurological: He is alert and oriented to person, place, and time.  Skin: Skin is warm and dry.  Small half inch by half inch wound noted inferior to the navel. No drainage present. Wound appears irritated and red.  Psychiatric: He has a normal mood and affect. His behavior is normal. Judgment and thought content normal.       Assessment & Plan:

## 2014-12-25 NOTE — Assessment & Plan Note (Signed)
Small wound consistent with ruptured boil, however cannot rule out other anthropology bite. Given redness, start doxycycline 10 days to cover for MRSA. Clean wound with soap and water. Keep covered. Follow up if symptoms worsen or fail to improve.

## 2014-12-27 ENCOUNTER — Other Ambulatory Visit: Payer: Self-pay | Admitting: *Deleted

## 2014-12-27 MED ORDER — TAMSULOSIN HCL 0.4 MG PO CAPS: 0.4000 mg | ORAL_CAPSULE | Freq: Every day | ORAL | Status: DC

## 2015-01-26 ENCOUNTER — Telehealth: Payer: Self-pay | Admitting: *Deleted

## 2015-01-26 DIAGNOSIS — M545 Low back pain: Secondary | ICD-10-CM

## 2015-01-26 MED ORDER — ALPRAZOLAM 1 MG PO TABS: 1.0000 mg | ORAL_TABLET | Freq: Three times a day (TID) | ORAL | Status: DC | PRN

## 2015-01-26 NOTE — Telephone Encounter (Signed)
Done

## 2015-01-26 NOTE — Telephone Encounter (Signed)
Done hardcopy to Cherina  

## 2015-01-26 NOTE — Telephone Encounter (Signed)
Received fax pt req refills on his alprazolam 1 mg...Raechel Chute/lmb

## 2015-02-01 DIAGNOSIS — G894 Chronic pain syndrome: Secondary | ICD-10-CM | POA: Diagnosis not present

## 2015-04-10 ENCOUNTER — Other Ambulatory Visit: Payer: Self-pay

## 2015-04-10 MED ORDER — CETIRIZINE HCL 10 MG PO TABS: 10.0000 mg | ORAL_TABLET | Freq: Every day | ORAL | Status: DC

## 2015-05-02 DIAGNOSIS — M542 Cervicalgia: Secondary | ICD-10-CM | POA: Diagnosis not present

## 2015-05-02 DIAGNOSIS — G894 Chronic pain syndrome: Secondary | ICD-10-CM | POA: Diagnosis not present

## 2015-06-05 ENCOUNTER — Other Ambulatory Visit: Payer: Self-pay

## 2015-06-05 MED ORDER — OMEPRAZOLE 20 MG PO CPDR: 20.0000 mg | DELAYED_RELEASE_CAPSULE | Freq: Every day | ORAL | Status: DC

## 2015-07-19 ENCOUNTER — Other Ambulatory Visit: Payer: Self-pay

## 2015-07-19 MED ORDER — TAMSULOSIN HCL 0.4 MG PO CAPS: 0.4000 mg | ORAL_CAPSULE | Freq: Every day | ORAL | Status: DC

## 2015-07-31 DIAGNOSIS — R0789 Other chest pain: Secondary | ICD-10-CM | POA: Diagnosis not present

## 2015-07-31 DIAGNOSIS — G894 Chronic pain syndrome: Secondary | ICD-10-CM | POA: Diagnosis not present

## 2015-07-31 DIAGNOSIS — M542 Cervicalgia: Secondary | ICD-10-CM | POA: Diagnosis not present

## 2015-07-31 DIAGNOSIS — M546 Pain in thoracic spine: Secondary | ICD-10-CM | POA: Diagnosis not present

## 2015-08-06 ENCOUNTER — Other Ambulatory Visit: Payer: Self-pay

## 2015-08-06 DIAGNOSIS — M545 Low back pain: Secondary | ICD-10-CM

## 2015-08-06 MED ORDER — ALPRAZOLAM 1 MG PO TABS: 1.0000 mg | ORAL_TABLET | Freq: Three times a day (TID) | ORAL | Status: DC | PRN

## 2015-08-06 NOTE — Telephone Encounter (Signed)
Please advise in MD's absence, last OV was 11/2014 with Tammy Sours

## 2015-08-07 NOTE — Telephone Encounter (Signed)
Rx faxed per Marcos Eke

## 2015-09-05 ENCOUNTER — Telehealth: Payer: Self-pay | Admitting: Internal Medicine

## 2015-09-05 MED ORDER — TAMSULOSIN HCL 0.4 MG PO CAPS: 0.4000 mg | ORAL_CAPSULE | Freq: Every day | ORAL | Status: DC

## 2015-09-05 NOTE — Telephone Encounter (Signed)
Pt request refill for tamsulosin (FLOMAX) 0.4 MG CAPS to be send into [place] [facility]. Please  help, he is out of this med.

## 2015-10-17 ENCOUNTER — Other Ambulatory Visit: Payer: Self-pay

## 2015-10-17 MED ORDER — TAMSULOSIN HCL 0.4 MG PO CAPS: 0.4000 mg | ORAL_CAPSULE | Freq: Every day | ORAL | Status: DC

## 2015-10-24 DIAGNOSIS — M542 Cervicalgia: Secondary | ICD-10-CM | POA: Diagnosis not present

## 2015-10-24 DIAGNOSIS — G894 Chronic pain syndrome: Secondary | ICD-10-CM | POA: Diagnosis not present

## 2015-11-16 ENCOUNTER — Encounter: Payer: Self-pay | Admitting: Internal Medicine

## 2015-11-16 ENCOUNTER — Ambulatory Visit (INDEPENDENT_AMBULATORY_CARE_PROVIDER_SITE_OTHER): Payer: Medicare Other | Admitting: Internal Medicine

## 2015-11-16 VITALS — BP 126/72 | HR 76 | Temp 98.6°F | Ht 65.0 in | Wt 171.0 lb

## 2015-11-16 DIAGNOSIS — N320 Bladder-neck obstruction: Secondary | ICD-10-CM

## 2015-11-16 DIAGNOSIS — Z1159 Encounter for screening for other viral diseases: Secondary | ICD-10-CM | POA: Diagnosis not present

## 2015-11-16 DIAGNOSIS — I10 Essential (primary) hypertension: Secondary | ICD-10-CM

## 2015-11-16 DIAGNOSIS — M545 Low back pain: Secondary | ICD-10-CM

## 2015-11-16 DIAGNOSIS — Z23 Encounter for immunization: Secondary | ICD-10-CM | POA: Diagnosis not present

## 2015-11-16 DIAGNOSIS — E785 Hyperlipidemia, unspecified: Secondary | ICD-10-CM | POA: Diagnosis not present

## 2015-11-16 DIAGNOSIS — M25562 Pain in left knee: Secondary | ICD-10-CM

## 2015-11-16 DIAGNOSIS — F411 Generalized anxiety disorder: Secondary | ICD-10-CM

## 2015-11-16 MED ORDER — ALPRAZOLAM 1 MG PO TABS: 1.0000 mg | ORAL_TABLET | Freq: Three times a day (TID) | ORAL | Status: DC | PRN

## 2015-11-16 MED ORDER — TAMSULOSIN HCL 0.4 MG PO CAPS: 0.4000 mg | ORAL_CAPSULE | Freq: Every day | ORAL | Status: DC

## 2015-11-16 MED ORDER — OMEPRAZOLE 20 MG PO CPDR: 20.0000 mg | DELAYED_RELEASE_CAPSULE | Freq: Every day | ORAL | Status: DC

## 2015-11-16 MED ORDER — MOMETASONE FUROATE 50 MCG/ACT NA SUSP: 2.0000 | Freq: Every day | NASAL | Status: DC

## 2015-11-16 NOTE — Progress Notes (Signed)
Pre visit review using our clinic review tool, if applicable. No additional management support is needed unless otherwise documented below in the visit note. 

## 2015-11-16 NOTE — Patient Instructions (Addendum)
You had the flu shot today  Please continue all other medications as before, and refills have been done if requested - the xanax and other meds  Please have the pharmacy call with any other refills you may need.  Please continue your efforts at being more active, low cholesterol diet, and weight control.  You are otherwise up to date with prevention measures today.  Please keep your appointments with your specialists as you may have planned  Please go to the XRAY Department in the Basement (go straight as you get off the elevator) for the x-ray testing at your convenience - for the left knee xray  Please go to the LAB in the Basement (turn left off the elevator) for the tests to be done at your convenience  You will be contacted by phone if any changes need to be made immediately.  Otherwise, you will receive a letter about your results with an explanation, but please check with MyChart first.  Please remember to sign up for MyChart if you have not done so, as this will be important to you in the future with finding out test results, communicating by private email, and scheduling acute appointments online when needed.  Please return in 1 year for your yearly visit, or sooner if needed

## 2015-11-16 NOTE — Progress Notes (Signed)
Subjective:    Patient ID: Phillip Mullen, male    DOB: [birth year], 50 y.o.   MRN: [id number]  HPI   Here for yearly f/u;  Overall doing ok;  Pt denies Chest pain, worsening SOB, DOE, wheezing, orthopnea, PND, worsening LE edema, palpitations, dizziness or syncope.  Pt denies neurological change such as new headache, facial or extremity weakness.  Pt denies polydipsia, polyuria, or low sugar symptoms. Pt states overall good compliance with treatment and medications, good tolerability, and has been trying to follow appropriate diet.  Pt denies worsening depressive symptoms, suicidal ideation or panic. No fever, night sweats, wt loss, loss of appetite, or other constitutional symptoms.  Pt states good ability with ADL's, has low fall risk, home safety reviewed and adequate, no other significant changes in hearing or vision, and only occasionally active with exercise. Not taking the statin, trying to work on diet.  Only take mobic prn, still with recurrent left knee pain worsening in past 2 wks, mild to mod, without swelilng, worse to stand, better to sit.  Denies urinary symptoms such as dysuria, frequency, urgency, flank pain, hematuria or n/v, fever, chills. Past Medical History  Diagnosis Date  . Anxiety   . GERD (gastroesophageal reflux disease)   . Headache(784.0)   . Hypertension   . Osteoarthritis   . Bilateral carpal tunnel syndrome 02/10/2013    Per Dr Amanda Pea  . Allergy   . Osteoporosis   . Seizures (HCC)     1 seisure at age 12 per pt   Past Surgical History  Procedure Laterality Date  . Inguinal hernia repair    . Tonsillectomy    . Abcess drainage      Infected finger    reports that he has been smoking Cigarettes.  He has been smoking about 1.00 pack per day. He has never used smokeless tobacco. He reports that he does not drink alcohol or use illicit drugs. family history is negative for Colon cancer, Esophageal cancer, Rectal cancer, and Stomach cancer. Allergies    Allergen Reactions  . Penicillins     REACTION: hives  . Sulfonamide Derivatives     Pt not sure of the reaction   Current Outpatient Prescriptions on File Prior to Visit  Medication Sig Dispense Refill  . aspirin 81 MG tablet Take 81 mg by mouth daily.      . cetirizine (ZYRTEC) 10 MG tablet Take 1 tablet (10 mg total) by mouth daily. 30 tablet 5  . magnesium hydroxide (MILK OF MAGNESIA) 800 MG/5ML suspension Take 15 mLs by mouth daily as needed.      Marland Kitchen oxyCODONE (OXY IR/ROXICODONE) 5 MG immediate release tablet Take 1 tablet (5 mg total) by mouth every 4 (four) hours as needed. Fill on or after 11/10/14 120 tablet 0  . OxyCODONE (OXYCONTIN) 40 mg T12A 12 hr tablet Take 1 tablet (40 mg total) by mouth 3 (three) times daily. To fill on or after 11/10/2014 90 tablet 0  . meloxicam (MOBIC) 15 MG tablet Take 1 tablet (15 mg total) by mouth daily. PATIENT WILL NEED TO EST NEW PCP FOR FURTHER REFILLS SINCE DR MICHAEL NORINS RETIRED (Patient not taking: Reported on 11/16/2015) 90 tablet 3   No current facility-administered medications on file prior to visit.    Review of Systems Constitutional: Negative for increased diaphoresis, other activity, appetite or siginficant weight change other than noted HENT: Negative for worsening hearing loss, ear pain, facial swelling, mouth sores and neck stiffness.  Eyes: Negative for other worsening pain, redness or visual disturbance.  Respiratory: Negative for shortness of breath and wheezing  Cardiovascular: Negative for chest pain and palpitations.  Gastrointestinal: Negative for diarrhea, blood in stool, abdominal distention or other pain Genitourinary: Negative for hematuria, flank pain or change in urine volume.  Musculoskeletal: Negative for myalgias or other joint complaints.  Skin: Negative for color change and wound or drainage.  Neurological: Negative for syncope and numbness. other than noted Hematological: Negative for adenopathy. or other  swelling Psychiatric/Behavioral: Negative for hallucinations, SI, self-injury, decreased concentration or other worsening agitation.      Objective:   Physical Exam BP 126/72 mmHg  Pulse 76  Temp(Src) 98.6 F (37 C) (Oral)  Ht  (1.651 m)  Wt 171 lb (77.565 kg)  BMI 28.46 kg/m2  SpO2 97% VS noted,  Constitutional: Pt is oriented to person, place, and time. Appears well-developed and well-nourished, in no significant distress Head: Normocephalic and atraumatic.  Right Ear: External ear normal.  Left Ear: External ear normal.  Nose: Nose normal.  Mouth/Throat: Oropharynx is clear and moist.  Eyes: Conjunctivae and EOM are normal. Pupils are equal, round, and reactive to light.  Neck: Normal range of motion. Neck supple. No JVD present. No tracheal deviation present or significant neck LA or mass Cardiovascular: Normal rate, regular rhythm, normal heart sounds and intact distal pulses.   Pulmonary/Chest: Effort normal and breath sounds without rales or wheezing  Abdominal: Soft. Bowel sounds are normal. NT. No HSM  Musculoskeletal: Normal range of motion. Exhibits no edema. left knee with bony arthritic changes Lymphadenopathy:  Has no cervical adenopathy.  Neurological: Pt is alert and oriented to person, place, and time. Pt has normal reflexes. No cranial nerve deficit. Motor grossly intact Skin: Skin is warm and dry. No rash noted.  Psychiatric:  Has normal mood and affect. Behavior is normal.     Assessment & Plan:

## 2015-11-17 DIAGNOSIS — M25562 Pain in left knee: Secondary | ICD-10-CM | POA: Insufficient documentation

## 2015-11-17 NOTE — Assessment & Plan Note (Signed)
stable overall by history and exam, recent data reviewed with pt, and pt to continue medical treatment as before,  to f/u any worsening symptoms or concerns Lab Results  Component Value Date   CHOL 201* 06/02/2014   HDL 38.30* 06/02/2014   LDLDIRECT 147.3 06/02/2014   TRIG 259.0* 06/02/2014   CHOLHDL 5 06/02/2014

## 2015-11-17 NOTE — Assessment & Plan Note (Signed)
stable overall by history and exam, recent data reviewed with pt, and pt to continue medical treatment as before,  to f/u any worsening symptoms or concerns BP Readings from Last 3 Encounters:  11/16/15 126/72  12/25/14 160/96  08/03/14 133/93

## 2015-11-17 NOTE — Assessment & Plan Note (Signed)
stable overall by history and exam, recent data reviewed with pt, and pt to continue medical treatment as before,  to f/u any worsening symptoms or concerns, for med refill Lab Results  Component Value Date   WBC 9.9 06/02/2014   HGB 13.8 06/02/2014   HCT 41.0 06/02/2014   PLT 225.0 06/02/2014   GLUCOSE 94 06/02/2014   CHOL 201* 06/02/2014   TRIG 259.0* 06/02/2014   HDL 38.30* 06/02/2014   LDLDIRECT 147.3 06/02/2014   ALT 21 06/02/2014   AST 17 06/02/2014   NA 137 06/02/2014   K 3.6 06/02/2014   CL 100 06/02/2014   CREATININE 0.8 06/02/2014   BUN 17 06/02/2014   CO2 29 06/02/2014   TSH 2.28 06/02/2014   PSA 0.36 06/02/2014

## 2015-11-17 NOTE — Assessment & Plan Note (Signed)
Also for psa as he is due, asympt,  to f/u any worsening symptoms or concerns 

## 2015-11-17 NOTE — Assessment & Plan Note (Signed)
C/w likely djd flare, for cont'd mobic prn, for left knee film,  to f/u any worsening symptoms or concerns

## 2015-11-27 ENCOUNTER — Other Ambulatory Visit: Payer: Self-pay

## 2015-11-27 MED ORDER — CETIRIZINE HCL 10 MG PO TABS: 10.0000 mg | ORAL_TABLET | Freq: Every day | ORAL | Status: DC

## 2016-03-17 ENCOUNTER — Other Ambulatory Visit: Payer: Self-pay | Admitting: Internal Medicine

## 2016-05-08 ENCOUNTER — Other Ambulatory Visit: Payer: Self-pay | Admitting: Internal Medicine

## 2016-06-03 ENCOUNTER — Other Ambulatory Visit: Payer: Self-pay | Admitting: Internal Medicine

## 2016-06-04 ENCOUNTER — Telehealth: Payer: Self-pay | Admitting: *Deleted

## 2016-06-04 DIAGNOSIS — M545 Low back pain: Secondary | ICD-10-CM

## 2016-06-04 MED ORDER — ALPRAZOLAM 1 MG PO TABS: 1.0000 mg | ORAL_TABLET | Freq: Three times a day (TID) | ORAL | 1 refills | Status: DC | PRN

## 2016-06-04 NOTE — Telephone Encounter (Signed)
Rx sent 

## 2016-06-04 NOTE — Telephone Encounter (Signed)
Done hardcopy to taylor wrenn 

## 2016-06-04 NOTE — Telephone Encounter (Signed)
Rec'd fax pt requesting refill on alprazolam 1 mg last filled 03/22/16...Raechel Chute/lmb

## 2016-07-09 ENCOUNTER — Other Ambulatory Visit: Payer: Self-pay | Admitting: Internal Medicine

## 2016-07-11 DIAGNOSIS — Z720 Tobacco use: Secondary | ICD-10-CM | POA: Diagnosis not present

## 2016-07-11 DIAGNOSIS — M791 Myalgia: Secondary | ICD-10-CM | POA: Diagnosis not present

## 2016-07-11 DIAGNOSIS — F4310 Post-traumatic stress disorder, unspecified: Secondary | ICD-10-CM | POA: Diagnosis not present

## 2016-07-11 DIAGNOSIS — F411 Generalized anxiety disorder: Secondary | ICD-10-CM | POA: Diagnosis not present

## 2016-07-11 DIAGNOSIS — F331 Major depressive disorder, recurrent, moderate: Secondary | ICD-10-CM | POA: Diagnosis not present

## 2016-07-11 DIAGNOSIS — G894 Chronic pain syndrome: Secondary | ICD-10-CM | POA: Diagnosis not present

## 2016-07-11 DIAGNOSIS — F410 Panic disorder [episodic paroxysmal anxiety] without agoraphobia: Secondary | ICD-10-CM | POA: Diagnosis not present

## 2016-08-12 ENCOUNTER — Telehealth: Payer: Self-pay | Admitting: *Deleted

## 2016-08-12 MED ORDER — ALPRAZOLAM 1 MG PO TABS: 1.0000 mg | ORAL_TABLET | Freq: Three times a day (TID) | ORAL | 0 refills | Status: DC | PRN

## 2016-08-12 NOTE — Telephone Encounter (Signed)
Rec'd script pt requesting refill on alprazolam. MD out of office pls advise...Raechel Chute/lmb

## 2016-08-12 NOTE — Telephone Encounter (Signed)
Faxed script back to pharmacy,,,.lmb

## 2016-08-12 NOTE — Telephone Encounter (Signed)
Medication refilled

## 2016-09-02 DIAGNOSIS — F4310 Post-traumatic stress disorder, unspecified: Secondary | ICD-10-CM | POA: Diagnosis not present

## 2016-09-02 DIAGNOSIS — F410 Panic disorder [episodic paroxysmal anxiety] without agoraphobia: Secondary | ICD-10-CM | POA: Diagnosis not present

## 2016-09-02 DIAGNOSIS — G894 Chronic pain syndrome: Secondary | ICD-10-CM | POA: Diagnosis not present

## 2016-09-02 DIAGNOSIS — M791 Myalgia: Secondary | ICD-10-CM | POA: Diagnosis not present

## 2016-09-02 DIAGNOSIS — F411 Generalized anxiety disorder: Secondary | ICD-10-CM | POA: Diagnosis not present

## 2016-09-08 ENCOUNTER — Telehealth: Payer: Self-pay | Admitting: Internal Medicine

## 2016-09-08 NOTE — Telephone Encounter (Signed)
Patient states Dr. Jonny RuizJohn was wanting him to have HEP C and routine labs done last time he was in.  States he did not have labs done b/c he still owed a balance and wanted to pay that off.  I didn't see routine labs in the system.  Patient is going to schedule appt with Jonny RuizJohn for an acute visit and flu shot this week.

## 2016-09-09 ENCOUNTER — Encounter: Payer: Self-pay | Admitting: Internal Medicine

## 2016-09-09 ENCOUNTER — Ambulatory Visit (INDEPENDENT_AMBULATORY_CARE_PROVIDER_SITE_OTHER): Payer: Medicare Other | Admitting: Internal Medicine

## 2016-09-09 ENCOUNTER — Other Ambulatory Visit (INDEPENDENT_AMBULATORY_CARE_PROVIDER_SITE_OTHER): Payer: Medicare Other

## 2016-09-09 VITALS — BP 140/80 | HR 68 | Temp 97.9°F | Resp 20 | Wt 157.0 lb

## 2016-09-09 DIAGNOSIS — E785 Hyperlipidemia, unspecified: Secondary | ICD-10-CM

## 2016-09-09 DIAGNOSIS — R5383 Other fatigue: Secondary | ICD-10-CM

## 2016-09-09 DIAGNOSIS — Z1159 Encounter for screening for other viral diseases: Secondary | ICD-10-CM

## 2016-09-09 DIAGNOSIS — R634 Abnormal weight loss: Secondary | ICD-10-CM

## 2016-09-09 DIAGNOSIS — I10 Essential (primary) hypertension: Secondary | ICD-10-CM

## 2016-09-09 DIAGNOSIS — N320 Bladder-neck obstruction: Secondary | ICD-10-CM

## 2016-09-09 DIAGNOSIS — R1030 Lower abdominal pain, unspecified: Secondary | ICD-10-CM | POA: Diagnosis not present

## 2016-09-09 DIAGNOSIS — R1310 Dysphagia, unspecified: Secondary | ICD-10-CM | POA: Diagnosis not present

## 2016-09-09 DIAGNOSIS — R109 Unspecified abdominal pain: Secondary | ICD-10-CM | POA: Insufficient documentation

## 2016-09-09 DIAGNOSIS — L0292 Furuncle, unspecified: Secondary | ICD-10-CM

## 2016-09-09 LAB — URINALYSIS, ROUTINE W REFLEX MICROSCOPIC
BILIRUBIN URINE: NEGATIVE
HGB URINE DIPSTICK: NEGATIVE
KETONES UR: NEGATIVE
LEUKOCYTES UA: NEGATIVE
Nitrite: NEGATIVE
Specific Gravity, Urine: <=1.005 — AB (ref 1.000–1.030)
TOTAL PROTEIN, URINE-UPE24: NEGATIVE
URINE GLUCOSE: NEGATIVE
UROBILINOGEN UA: 0.2 (ref 0.0–1.0)
WBC, UA: NONE SEEN — AB (ref 0–?)
pH: 6 (ref 5.0–8.0)

## 2016-09-09 LAB — HEPATIC FUNCTION PANEL
ALBUMIN: 4.4 g/dL (ref 3.5–5.2)
ALK PHOS: 73 U/L (ref 39–117)
ALT: 17 U/L (ref 0–53)
AST: 15 U/L (ref 0–37)
Bilirubin, Direct: 0.1 mg/dL (ref 0.0–0.3)
TOTAL PROTEIN: 7.3 g/dL (ref 6.0–8.3)
Total Bilirubin: 0.5 mg/dL (ref 0.2–1.2)

## 2016-09-09 LAB — LIPID PANEL
CHOLESTEROL: 190 mg/dL (ref 0–200)
HDL: 50.3 mg/dL (ref >39.00)
LDL Cholesterol: 117 mg/dL — ABNORMAL HIGH (ref 0–99)
NonHDL: 140.16
Total CHOL/HDL Ratio: 4
Triglycerides: 115 mg/dL (ref 0.0–149.0)
VLDL: 23 mg/dL (ref 0.0–40.0)

## 2016-09-09 LAB — CBC WITH DIFFERENTIAL/PLATELET
BASOS ABS: 0 10*3/uL (ref 0.0–0.1)
Basophils Relative: 0.5 % (ref 0.0–3.0)
EOS ABS: 0.2 10*3/uL (ref 0.0–0.7)
Eosinophils Relative: 2.6 % (ref 0.0–5.0)
HCT: 42.6 % (ref 39.0–52.0)
Hemoglobin: 14.3 g/dL (ref 13.0–17.0)
LYMPHS ABS: 1.8 10*3/uL (ref 0.7–4.0)
LYMPHS PCT: 25.7 % (ref 12.0–46.0)
MCHC: 33.6 g/dL (ref 30.0–36.0)
MCV: 83.8 fl (ref 78.0–100.0)
MONOS PCT: 9.6 % (ref 3.0–12.0)
Monocytes Absolute: 0.7 10*3/uL (ref 0.1–1.0)
NEUTROS PCT: 61.6 % (ref 43.0–77.0)
Neutro Abs: 4.4 10*3/uL (ref 1.4–7.7)
PLATELETS: 253 10*3/uL (ref 150.0–400.0)
RBC: 5.08 Mil/uL (ref 4.22–5.81)
RDW: 13.4 % (ref 11.5–15.5)
WBC: 7.1 10*3/uL (ref 4.0–10.5)

## 2016-09-09 LAB — BASIC METABOLIC PANEL
BUN: 11 mg/dL (ref 6–23)
CALCIUM: 9.5 mg/dL (ref 8.4–10.5)
CHLORIDE: 103 meq/L (ref 96–112)
CO2: 28 meq/L (ref 19–32)
CREATININE: 0.69 mg/dL (ref 0.40–1.50)
GFR: 127.83 mL/min (ref >60.00)
GLUCOSE: 73 mg/dL (ref 70–99)
Potassium: 4.5 mEq/L (ref 3.5–5.1)
Sodium: 137 mEq/L (ref 135–145)

## 2016-09-09 LAB — LIPASE: Lipase: 28 U/L (ref 11.0–59.0)

## 2016-09-09 LAB — TSH: TSH: 1.18 u[IU]/mL (ref 0.35–4.50)

## 2016-09-09 LAB — PSA: PSA: 0.25 ng/mL (ref 0.10–4.00)

## 2016-09-09 MED ORDER — MOMETASONE FUROATE 50 MCG/ACT NA SUSP: 2.0000 | Freq: Every day | NASAL | 11 refills | Status: DC

## 2016-09-09 MED ORDER — DOXYCYCLINE HYCLATE 100 MG PO TABS: 100.0000 mg | ORAL_TABLET | Freq: Two times a day (BID) | ORAL | 0 refills | Status: DC

## 2016-09-09 NOTE — Progress Notes (Signed)
Pre visit review using our clinic review tool, if applicable. No additional management support is needed unless otherwise documented below in the visit note. 

## 2016-09-09 NOTE — Progress Notes (Addendum)
Subjective:    Patient ID: Phillip Mullen, male    DOB: [birth year], 50 y.o.   MRN: [id number]  HPI  Here with 2 wks onset boil to LLQ with pain/red/swelling gradually worsening, but without f/c or drainage.  Does also c/o fatigue for 3 mo with lower abd crampy pain, nausea  HA's and just lack of energy. Denies worsening reflux, other abd pain, n/v, bowel change or blood, but has had significant dysphagai, mild to solids  Has lost significant wt recent, appetite with no change in diet per pt  .Denies urinary symptoms such as dysuria, frequency, urgency, flank pain, hematuria or n/v, fever, chills.  Has not really been trying to follow lower choleterol diet. Does c/o ongoing fatigue, but denies signficant daytime hypersomnolence. Wt Readings from Last 3 Encounters:  09/09/16 157 lb (71.2 kg)  11/16/15 171 lb (77.6 kg)  12/25/14 173 lb (78.5 kg)   Past Medical History:  Diagnosis Date  . Allergy   . Anxiety   . Bilateral carpal tunnel syndrome 02/10/2013   Per Dr Amanda PeaGramig  . GERD (gastroesophageal reflux disease)   . Headache(784.0)   . Hypertension   . Osteoarthritis   . Osteoporosis   . Seizures (HCC)    1 seisure at age 89 per pt   Past Surgical History:  Procedure Laterality Date  . ABCESS DRAINAGE     Infected finger  . INGUINAL HERNIA REPAIR    . TONSILLECTOMY      reports that he has been smoking Cigarettes.  He has been smoking about 1.00 pack per day. He has never used smokeless tobacco. He reports that he does not drink alcohol or use drugs. family history is not on file. Allergies  Allergen Reactions  . Penicillins     REACTION: hives  . Sulfonamide Derivatives     Pt not sure of the reaction   Current Outpatient Prescriptions on File Prior to Visit  Medication Sig Dispense Refill  . ALPRAZolam (XANAX) 1 MG tablet Take 1 tablet (1 mg total) by mouth 3 (three) times daily as needed. 90 tablet 0  . aspirin 81 MG tablet Take 81 mg by mouth daily.      . cetirizine  (ZYRTEC) 10 MG tablet TAKE 1 TABLET BY MOUTH ONCE A DAY 30 tablet 3  . magnesium hydroxide (MILK OF MAGNESIA) 800 MG/5ML suspension Take 15 mLs by mouth daily as needed.      Marland Kitchen. omeprazole (PRILOSEC) 20 MG capsule TAKE 1 CAPSULE BY MOUTH ONCE DAILY 90 capsule 3  . oxyCODONE (OXY IR/ROXICODONE) 5 MG immediate release tablet Take 1 tablet (5 mg total) by mouth every 4 (four) hours as needed. Fill on or after 11/10/14 120 tablet 0  . OxyCODONE (OXYCONTIN) 40 mg T12A 12 hr tablet Take 1 tablet (40 mg total) by mouth 3 (three) times daily. To fill on or after 11/10/2014 90 tablet 0  . tamsulosin (FLOMAX) 0.4 MG CAPS capsule TAKE 1 CAPSULE BY MOUTH ONCE DAILY 30 capsule 3   No current facility-administered medications on file prior to visit.    Review of Systems  Constitutional: Negative for unusual diaphoresis or night sweats HENT: Negative for ear swelling or discharge Eyes: Negative for worsening visual haziness  Respiratory: Negative for choking and stridor.   Gastrointestinal: Negative for distension or worsening eructation Genitourinary: Negative for retention or change in urine volume.  Musculoskeletal: Negative for other MSK pain or swelling Skin: Negative for color change and worsening wound Neurological: Negative for  tremors and numbness other than noted  Psychiatric/Behavioral: Negative for decreased concentration or agitation other than above   All other system neg per pt    Objective:   Physical Exam BP 140/80   Pulse 68   Temp 97.9 F (36.6 C) (Oral)   Resp 20   Wt 157 lb (71.2 kg)   SpO2 97%   BMI 26.13 kg/m  VS noted, non toxic Constitutional: Pt appears in no apparent distress HENT: Head: NCAT.  Right Ear: External ear normal.  Left Ear: External ear normal.  Eyes: . Pupils are equal, round, and reactive to light. Conjunctivae and EOM are normal Neck: Normal range of motion. Neck supple.  Cardiovascular: Normal rate and regular rhythm.   Pulmonary/Chest: Effort normal  and breath sounds without rales or wheezing.  Abd:  Soft, NT, ND, + BS Neurological: Pt is alert. Not confused , motor 5/5 intact, gait intact Skin: Skin is warm. No rash, no LE edema, 1 cm area LLQ abd wall with tender, red , swelling without fluctuance or abscess drainage Psychiatric: Pt behavior is normal. No agitation. mild nervous No other significant exam findings    Assessment & Plan:

## 2016-09-09 NOTE — Patient Instructions (Addendum)
You had the flu shot today  Please take all new medication as prescribed - the antibiotic  Please continue all other medications as before, and refills have been done if requested.  Please have the pharmacy call with any other refills you may need.  Please continue your efforts at being more active, low cholesterol diet, and weight control.  You are otherwise up to date with prevention measures today.  You will be contacted regarding the referral for: CT scan abdomen/pelvis, and [facility] ([facility])  Please keep your appointments with your specialists as you may have planned  Please go to the LAB in the Basement (turn left off the elevator) for the tests to be done today  You will be contacted by phone if any changes need to be made immediately.  Otherwise, you will receive a letter about your results with an explanation, but please check with MyChart first.  Please remember to sign up for MyChart if you have not done so, as this will be important to you in the future with finding out test results, communicating by private email, and scheduling acute appointments online when needed.  Please return in 6 months, or sooner if needed

## 2016-09-10 LAB — HEPATITIS C ANTIBODY: HCV Ab: NEGATIVE

## 2016-09-14 DIAGNOSIS — L0292 Furuncle, unspecified: Secondary | ICD-10-CM | POA: Insufficient documentation

## 2016-09-14 NOTE — Assessment & Plan Note (Addendum)
Etiology unclear, Exam otherwise benign, to check labs as documented, follow with expectant management  Note:  Total time for pt hx, exam, review of record with pt in the room, determination of diagnoses and plan for further eval and tx is > 40 min, with over 50% spent in coordination and counseling of patient  

## 2016-09-14 NOTE — Assessment & Plan Note (Signed)
stable overall by history and exam, recent data reviewed with pt, and pt to continue medical treatment as before,  to f/u any worsening symptoms or concerns Lab Results  Component Value Date   LDLCALC 117 (H) 09/09/2016   Pt declines statin for now, for lower chol diet

## 2016-09-14 NOTE — Assessment & Plan Note (Signed)
Etiology unclaer, for GI referral, cont to monitor wt

## 2016-09-14 NOTE — Assessment & Plan Note (Signed)
Asympt, also for psa as he is due 

## 2016-09-14 NOTE — Assessment & Plan Note (Addendum)
Etiology unclear, for labs today as documented, also ct abd /pelvis, and GI referral

## 2016-09-14 NOTE — Assessment & Plan Note (Signed)
Etiology unclear, is at risk for DM, for f/u lab ans GI evaluation as well.

## 2016-09-14 NOTE — Assessment & Plan Note (Signed)
Mild to mod, for antibx course,  to f/u any worsening symptoms or concerns 

## 2016-09-14 NOTE — Assessment & Plan Note (Signed)
stable overall by history and exam, recent data reviewed with pt, and pt to continue medical treatment as before,  to f/u any worsening symptoms or concerns BP Readings from Last 3 Encounters:  09/09/16 140/80  11/16/15 126/72  12/25/14 (!) 160/96

## 2016-09-17 ENCOUNTER — Ambulatory Visit (INDEPENDENT_AMBULATORY_CARE_PROVIDER_SITE_OTHER)
Admission: RE | Admit: 2016-09-17 | Discharge: 2016-09-17 | Disposition: A | Discharge status: Home / Self Care | Payer: Medicare Other | Source: Ambulatory Visit | Attending: Internal Medicine | Admitting: Internal Medicine

## 2016-09-17 ENCOUNTER — Encounter: Payer: Self-pay | Admitting: Internal Medicine

## 2016-09-17 DIAGNOSIS — N281 Cyst of kidney, acquired: Secondary | ICD-10-CM | POA: Diagnosis not present

## 2016-09-17 DIAGNOSIS — R1030 Lower abdominal pain, unspecified: Secondary | ICD-10-CM

## 2016-09-17 DIAGNOSIS — R1310 Dysphagia, unspecified: Secondary | ICD-10-CM

## 2016-09-17 DIAGNOSIS — R634 Abnormal weight loss: Secondary | ICD-10-CM | POA: Diagnosis not present

## 2016-09-17 MED ORDER — IOPAMIDOL (ISOVUE-300) INJECTION 61%
100.0000 mL | Freq: Once | INTRAVENOUS | Status: AC | PRN
  Administered 2016-09-17: 100 mL via INTRAVENOUS

## 2016-09-22 DIAGNOSIS — F411 Generalized anxiety disorder: Secondary | ICD-10-CM | POA: Diagnosis not present

## 2016-09-29 ENCOUNTER — Encounter: Payer: Self-pay | Admitting: Physician Assistant

## 2016-09-29 ENCOUNTER — Ambulatory Visit (INDEPENDENT_AMBULATORY_CARE_PROVIDER_SITE_OTHER): Payer: Medicare Other | Admitting: Physician Assistant

## 2016-09-29 VITALS — BP 118/70 | HR 76 | Ht 65.0 in | Wt 158.4 lb

## 2016-09-29 DIAGNOSIS — R1032 Left lower quadrant pain: Secondary | ICD-10-CM | POA: Diagnosis not present

## 2016-09-29 DIAGNOSIS — R12 Heartburn: Secondary | ICD-10-CM

## 2016-09-29 DIAGNOSIS — R1319 Other dysphagia: Secondary | ICD-10-CM

## 2016-09-29 DIAGNOSIS — G8929 Other chronic pain: Secondary | ICD-10-CM

## 2016-09-29 DIAGNOSIS — R938 Abnormal findings on diagnostic imaging of other specified body structures: Secondary | ICD-10-CM

## 2016-09-29 DIAGNOSIS — R1031 Right lower quadrant pain: Secondary | ICD-10-CM | POA: Diagnosis not present

## 2016-09-29 DIAGNOSIS — R9389 Abnormal findings on diagnostic imaging of other specified body structures: Secondary | ICD-10-CM

## 2016-09-29 DIAGNOSIS — R110 Nausea: Secondary | ICD-10-CM

## 2016-09-29 MED ORDER — NA SULFATE-K SULFATE-MG SULF 17.5-3.13-1.6 GM/177ML PO SOLN: 1.0000 | Freq: Once | ORAL | 0 refills | Status: AC

## 2016-09-29 MED ORDER — OMEPRAZOLE 20 MG PO CPDR: 20.0000 mg | DELAYED_RELEASE_CAPSULE | Freq: Every day | ORAL | 11 refills | Status: DC

## 2016-09-29 NOTE — Patient Instructions (Addendum)
You have been given a separate informational sheet regarding your tobacco use, the importance of quitting and local resources to help you quit.   We sent refills for Omeprazole 20 mg [facility].  You have been scheduled for an endoscopy and colonoscopy. Please follow the written instructions given to you at your visit today. Please pick up your prep supplies at the pharmacy within the next 1-3 days. If you use inhalers (even only as needed), please bring them with you on the day of your procedure. Your physician has requested that you go to www.startemmi.com and enter the access code given to you at your visit today. This web site gives a general overview about your procedure. However, you should still follow specific instructions given to you by our office regarding your preparation for the procedure.

## 2016-09-29 NOTE — Progress Notes (Signed)
Subjective:    Patient ID: Phillip Mullen, male    DOB: [birth year], 50 y.o.   MRN: [id number]  HPI Phillip Mullen is a 50 year old white male referred today by Dr. Cathlean Cower. Patient known to Dr. Henrene Pastor from screening colonoscopy done in October 2015. This was a normal exam. Patient has history of hypertension, GERD, generalized anxiety disorder, PTSD, and chronic pain syndrome, for which he is on chronic narcotics. Patient had seen Dr. Jenny Reichmann recently with complaints of lower abdominal pain which she says has been present intermittently over the past 3 months. He says the pain waxes and wanes and when he is hurting a lot he has associated nausea. His bowel movements have been normal he has not noted any melena or hematochezia. He has had some recent decrease in his appetite and weight is down from 180-150 90 he says he feels better over the past couple of weeks and has picked up some weight. He has had also had intermittent nausea feels very fatigued and has absolutely no energy. He is a vague historian but does complain of hoarseness heartburn and when asked states that he will have difficulty with solid food and pills wanting to stick in his esophagus. He says his stools have been darker than usual intermittently but no overt melena or hematochezia. Denies any regular NSAID use, takes aspirin 81 mg daily. He has not been on a PPI recently Recent labs done 11 as an 24 with normal CBC , CMET and TSH CT of the abdomen and pelvis done 09/17/2016; shows a normal liver, contracted gallbladder there was stool throughout much of the colon and a focal area of luminal thickening and narrowing in the proximal transverse colon and also an area of soft tissue prominence in the ascending colon of these areas warrant direct visualization to assess for possible colonic tic involvement, also noted atherosclerotic calcification, but major mesenteric vessels appear patent.  Review of Systems Pertinent positive and negative  review of systems were noted in the above HPI section.  All other review of systems was otherwise negative.  Outpatient Encounter Prescriptions as of 09/29/2016  Medication Sig  . ALPRAZolam (XANAX) 1 MG tablet Take 1 tablet (1 mg total) by mouth 3 (three) times daily as needed.  Marland Kitchen aspirin 81 MG tablet Take 81 mg by mouth daily.    . cetirizine (ZYRTEC) 10 MG tablet TAKE 1 TABLET BY MOUTH ONCE A DAY  . magnesium hydroxide (MILK OF MAGNESIA) 800 MG/5ML suspension Take 15 mLs by mouth daily as needed.    . mometasone (NASONEX) 50 MCG/ACT nasal spray Place 2 sprays into the nose daily.  Marland Kitchen omeprazole (PRILOSEC) 20 MG capsule Take 1 capsule (20 mg total) by mouth daily.  Marland Kitchen oxyCODONE (OXY IR/ROXICODONE) 5 MG immediate release tablet Take 1 tablet (5 mg total) by mouth every 4 (four) hours as needed. Fill on or after 11/10/14  . OxyCODONE (OXYCONTIN) 40 mg T12A 12 hr tablet Take 1 tablet (40 mg total) by mouth 3 (three) times daily. To fill on or after 11/10/2014  . tamsulosin (FLOMAX) 0.4 MG CAPS capsule TAKE 1 CAPSULE BY MOUTH ONCE DAILY  . [DISCONTINUED] omeprazole (PRILOSEC) 20 MG capsule TAKE 1 CAPSULE BY MOUTH ONCE DAILY  . Na Sulfate-K Sulfate-Mg Sulf 17.5-3.13-1.6 GM/180ML SOLN Take 1 kit by mouth once.  . [DISCONTINUED] doxycycline (VIBRA-TABS) 100 MG tablet Take 1 tablet (100 mg total) by mouth 2 (two) times daily.   No facility-administered encounter medications on file as of 09/29/2016.  Allergies  Allergen Reactions  . Penicillins     REACTION: hives  . Sulfonamide Derivatives     Pt not sure of the reaction   Patient Active Problem List   Diagnosis Date Noted  . Boil 09/14/2016  . Abdominal pain 09/09/2016  . Loss of weight 09/09/2016  . Dysphagia 09/09/2016  . Fatigue 09/09/2016  . Left knee pain 11/17/2015  . Hyperlipidemia 11/16/2015  . Rash and nonspecific skin eruption 12/25/2014  . Bladder neck obstruction 06/01/2014  . Routine health maintenance 09/09/2013  . Benign  prostatic hypertrophy (BPH) with incomplete bladder emptying 07/29/2013  . Bilateral carpal tunnel syndrome 02/10/2013  . Other malaise and fatigue 02/10/2013  . Shoulder pain, left 04/09/2012  . Nausea with vomiting 02/07/2010  . BACK PAIN, CHRONIC 11/21/2008  . ALLERGIC RHINITIS, CHRONIC 02/23/2008  . Anxiety state 07/19/2007  . Essential hypertension 07/19/2007  . GERD 07/19/2007  . OSTEOARTHRITIS 07/19/2007  . Headache(784.0) 07/19/2007   Social History   Social History  . Marital status: Single    Spouse name: N/A  . Number of children: N/A  . Years of education: N/A   Occupational History  . Not on file.   Social History Main Topics  . Smoking status: Current Every Day Smoker    Packs/day: 1.00    Types: Cigarettes  . Smokeless tobacco: Never Used     Comment: form given 09-29-16  . Alcohol use No  . Drug use: No  . Sexual activity: Not on file   Other Topics Concern  . 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      Objective:    Vitals:   09/29/16 1411  BP: 118/70  Pulse: 76    Physical Exam Well-developed white male in no acute distress, lying on the exam table, blood pressure 118/70 pulse 76, height 5 foot 5, weight 158 BMI 26.3. HEENT; nontraumatic, cephalic EOMI PERRLA sclera anicteric, Cardiovascular; regular rate and rhythm with S1-S2 no murmur or gallop, Pulmonary ;clear bilaterally, Abdomen; soft no focal tenderness no guarding or rebound no palpable mass or hepatosplenomegaly bowel sounds are present, Rectal ;exam not done, Extremities; no clubbing cyanosis or edema skin warm and dry, Neuropsych ;mood and affect appropriate      Assessment & Plan:   #70 50 year old white male with complaints of lower abdominal pain intermittently over 3  months, mild weight loss, raising concern for a luminal narrowing in the proximal transverse colon and soft tissue prominence in the ascending colon cannot exclude neoplastic involvement. #2 chronic GERD with recent assistant heartburn and intermittent solid food dysphagia rule out stricture #3 chronic pain syndrome/chronic narcotic use #4 generalized anxiety disorder #5 PTSD #6 hypertension #7 nephrolithiasis  Plan; start omeprazole 20 mg by mouth every morning Patient will be scheduled for colonoscopy and EGD with possible dilation with Dr. Scarlette Shorts. Procedures discussed in detail with patient including risks and benefits and he is agreeable to proceed.  [name] [name] PA-C 09/29/2016   Cc: Phillip Borg, MD

## 2016-09-30 NOTE — Progress Notes (Signed)
Agree with initial assessment and plans as outlined 

## 2016-10-07 ENCOUNTER — Other Ambulatory Visit: Payer: Self-pay | Admitting: Internal Medicine

## 2016-10-13 ENCOUNTER — Encounter: Payer: Medicare Other | Admitting: Internal Medicine

## 2016-10-15 ENCOUNTER — Telehealth: Payer: Self-pay | Admitting: Internal Medicine

## 2016-10-15 NOTE — Telephone Encounter (Signed)
Called patient to schedule annual wellness appt. Left message for patient to call office to schedule appt.  °

## 2016-11-17 DIAGNOSIS — F39 Unspecified mood [affective] disorder: Secondary | ICD-10-CM | POA: Diagnosis not present

## 2016-11-17 DIAGNOSIS — Z79891 Long term (current) use of opiate analgesic: Secondary | ICD-10-CM | POA: Diagnosis not present

## 2016-11-17 DIAGNOSIS — F411 Generalized anxiety disorder: Secondary | ICD-10-CM | POA: Diagnosis not present

## 2016-11-26 ENCOUNTER — Telehealth: Payer: Self-pay | Admitting: Internal Medicine

## 2016-11-26 NOTE — Telephone Encounter (Signed)
Returned patient's call and he states that he had trail mix yesterday and a hamburger today with mayonnaise and lettuce.  I advised him that he was not supposed to have those things per instructions given to him.  I explained to him again that he is to have no solids the rest of today, only clear liquids listed on his instructions.  Also, explained that he is to have nothing by mouth 3 hours prior to his procedure.  I advised that if he does not do this, we could possibly not do his procedure tomorrow.  He stated that he understood and all questions were answered.

## 2016-11-27 ENCOUNTER — Encounter: Payer: Self-pay | Admitting: Internal Medicine

## 2016-11-27 ENCOUNTER — Ambulatory Visit (AMBULATORY_SURGERY_CENTER): Payer: Medicare Other | Admitting: Internal Medicine

## 2016-11-27 VITALS — BP 124/79 | HR 52 | Temp 98.6°F | Resp 11 | Ht 65.0 in | Wt 158.0 lb

## 2016-11-27 DIAGNOSIS — K633 Ulcer of intestine: Secondary | ICD-10-CM

## 2016-11-27 DIAGNOSIS — R1310 Dysphagia, unspecified: Secondary | ICD-10-CM

## 2016-11-27 DIAGNOSIS — R935 Abnormal findings on diagnostic imaging of other abdominal regions, including retroperitoneum: Secondary | ICD-10-CM | POA: Diagnosis not present

## 2016-11-27 DIAGNOSIS — K219 Gastro-esophageal reflux disease without esophagitis: Secondary | ICD-10-CM

## 2016-11-27 DIAGNOSIS — R1319 Other dysphagia: Secondary | ICD-10-CM

## 2016-11-27 DIAGNOSIS — R12 Heartburn: Secondary | ICD-10-CM | POA: Diagnosis not present

## 2016-11-27 DIAGNOSIS — I10 Essential (primary) hypertension: Secondary | ICD-10-CM | POA: Diagnosis not present

## 2016-11-27 DIAGNOSIS — R110 Nausea: Secondary | ICD-10-CM | POA: Diagnosis not present

## 2016-11-27 DIAGNOSIS — R933 Abnormal findings on diagnostic imaging of other parts of digestive tract: Secondary | ICD-10-CM | POA: Diagnosis not present

## 2016-11-27 DIAGNOSIS — K529 Noninfective gastroenteritis and colitis, unspecified: Secondary | ICD-10-CM | POA: Diagnosis not present

## 2016-11-27 DIAGNOSIS — R109 Unspecified abdominal pain: Secondary | ICD-10-CM | POA: Diagnosis not present

## 2016-11-27 DIAGNOSIS — R1032 Left lower quadrant pain: Secondary | ICD-10-CM

## 2016-11-27 MED ORDER — SODIUM CHLORIDE 0.9 % IV SOLN: 500.0000 mL | INTRAVENOUS | Status: DC

## 2016-11-27 NOTE — Patient Instructions (Signed)
YOU HAD AN ENDOSCOPIC PROCEDURE TODAY AT THE [facility]:   Refer to the procedure report that was given to you for any specific questions about what was found during the examination.  If the procedure report does not answer your questions, please call your gastroenterologist to clarify.  If you requested that your care partner not be given the details of your procedure findings, then the procedure report has been included in a sealed envelope for you to review at your convenience later.  YOU SHOULD EXPECT: Some feelings of bloating in the abdomen. Passage of more gas than usual.  Walking can help get rid of the air that was put into your GI tract during the procedure and reduce the bloating. If you had a lower endoscopy (such as a colonoscopy or flexible sigmoidoscopy) you may notice spotting of blood in your stool or on the toilet paper. If you underwent a bowel prep for your procedure, you may not have a normal bowel movement for a few days.  Please Note:  You might notice some irritation and congestion in your nose or some drainage.  This is from the oxygen used during your procedure.  There is no need for concern and it should clear up in a day or so.  SYMPTOMS TO REPORT IMMEDIATELY:   Following lower endoscopy (colonoscopy or flexible sigmoidoscopy):  Excessive amounts of blood in the stool  Significant tenderness or worsening of abdominal pains  Swelling of the abdomen that is new, acute  Fever of 100F or higher   Following upper endoscopy (EGD)  Vomiting of blood or coffee ground material  New chest pain or pain under the shoulder blades  Painful or persistently difficult swallowing  New shortness of breath  Fever of 100F or higher  Black, tarry-looking stools  For urgent or emergent issues, a gastroenterologist can be reached at any hour by calling [phone].   DIET:  We do recommend a small meal at first, but then you may proceed to your regular diet.  Drink  plenty of fluids but you should avoid alcoholic beverages for 24 hours.  ACTIVITY:  You should plan to take it easy for the rest of today and you should NOT DRIVE or use heavy machinery until tomorrow (because of the sedation medicines used during the test).    FOLLOW UP: Our staff will call the number listed on your records the next business day following your procedure to check on you and address any questions or concerns that you may have regarding the information given to you following your procedure. If we do not reach you, we will leave a message.  However, if you are feeling well and you are not experiencing any problems, there is no need to return our call.  We will assume that you have returned to your regular daily activities without incident.  If any biopsies were taken you will be contacted by phone or by letter within the next 1-3 weeks.  Please call us at [phone] if you have not heard about the biopsies in 3 weeks.    SIGNATURES/CONFIDENTIALITY: You and/or your care partner have signed paperwork which will be entered into your electronic medical record.  These signatures attest to the fact that that the information above on your After Visit Summary has been reviewed and is understood.  Full responsibility of the confidentiality of this discharge information lies with you and/or your care-partner.   Resume medications. Information given on reflux.

## 2016-11-27 NOTE — Op Note (Signed)
[facility] Patient Name: Phillip Mullen Procedure Date: 11/27/2016 1:54 PM MRN: [id number] Endoscopist: Wilhemina Bonito. Marina Goodell , MD Age: 51 Referring MD:  Date of Birth: [birth year] Gender: Male Account #: [id number] Procedure:                Colonoscopy, with biopsy and submucosal injection                            of ink Tattoo Indications:              Abdominal pain, Abnormal CT of the GI tract in the                            region of the proximal transverse colon Medicines:                Monitored Anesthesia Care Procedure:                Pre-Anesthesia Assessment:                           - Prior to the procedure, a History and Physical                            was performed, and patient medications and                            allergies were reviewed. The patient's tolerance of                            previous anesthesia was also reviewed. The risks                            and benefits of the procedure and the sedation                            options and risks were discussed with the patient.                            All questions were answered, and informed consent                            was obtained. Prior Anticoagulants: The patient has                            taken no previous anticoagulant or antiplatelet                            agents. ASA Grade Assessment: II - A patient with                            mild systemic disease. After reviewing the risks                            and benefits, the patient was deemed in  satisfactory condition to undergo the procedure.                           After obtaining informed consent, the colonoscope                            was passed under direct vision. Throughout the                            procedure, the patient's blood pressure, pulse, and                            oxygen saturations were monitored continuously. The                            Model CF-HQ190L  [id number]) scope was introduced                            through the anus and advanced to the the cecum,                            identified by appendiceal orifice and ileocecal                            valve. The ileocecal valve, appendiceal orifice,                            and rectum were photographed. The quality of the                            bowel preparation was excellent. The colonoscopy                            was performed without difficulty. The patient                            tolerated the procedure well. The bowel preparation                            used was SUPREP. Scope In: 2:29:06 PM Scope Out: 2:44:42 PM Scope Withdrawal Time: 0 hours 12 minutes 33 seconds  Total Procedure Duration: 0 hours 15 minutes 36 seconds  Findings:                 A single umbilicated region consistent with                            diverticulum was found in the hepatic flexure.                            please see photographs.                           The mucosa within this region was ulcerated.  Multiple Biopsies were taken with a cold forceps in                            around this region for histology. The area was                            subsequently marked with SPOT INK tattoo just                            distal to the lesion. Please see photographs.                           The exam was otherwise without abnormality on                            direct and retroflexion views. Complications:            No immediate complications. Estimated blood loss:                            None. Estimated Blood Loss:     Estimated blood loss: none. Impression:               - Probable inflamed/ulcerated isolated diverticulum                            in the region of the hepatic flexure. Biopsied and                            tattoo.                           - The examination was otherwise normal on direct                            and  retroflexion views. Recommendation:           - Repeat colonoscopy in 10 years for screening                            purposes if biopsies negative.                           - Patient has a contact number available for                            emergencies. The signs and symptoms of potential                            delayed complications were discussed with the                            patient. Return to normal activities tomorrow.                            Written discharge instructions were provided to the  patient.                           - Resume previous diet.                           - Continue present medications.                           - Await pathology results. Wilhemina Bonito. Marina Goodell, MD 11/27/2016 3:00:03 PM This report has been signed electronically.

## 2016-11-27 NOTE — Progress Notes (Signed)
Patient awakening,vss,report to rn 

## 2016-11-27 NOTE — Op Note (Signed)
[facility] Patient Name: Phillip Mullen Procedure Date: 11/27/2016 1:54 PM MRN: [id number] Endoscopist: Wilhemina BonitoJohn N. Marina GoodellPerry , MD Age: [age] Referring MD:  Date of Birth: 12/16/1965 Gender: Male Account #: [id number] Procedure:                Upper GI endoscopy Indications:              Dysphagia Medicines:                Monitored Anesthesia Care Procedure:                Pre-Anesthesia Assessment:                           - Prior to the procedure, a History and Physical                            was performed, and patient medications and                            allergies were reviewed. The patient's tolerance of                            previous anesthesia was also reviewed. The risks                            and benefits of the procedure and the sedation                            options and risks were discussed with the patient.                            All questions were answered, and informed consent                            was obtained. Prior Anticoagulants: The patient has                            taken no previous anticoagulant or antiplatelet                            agents. ASA Grade Assessment: II - A patient with                            mild systemic disease. After reviewing the risks                            and benefits, the patient was deemed in                            satisfactory condition to undergo the procedure.                           After obtaining informed consent, the endoscope was  passed under direct vision. Throughout the                            procedure, the patient's blood pressure, pulse, and                            oxygen saturations were monitored continuously. The                            Model GIF-HQ190 [id number]) scope was introduced                            through the mouth, and advanced to the second part                            of duodenum. The upper GI endoscopy was                          accomplished without difficulty. The patient                            tolerated the procedure well. Scope In: Scope Out: Findings:                 The esophagus was normal.                           The stomach was normal.                           The examined duodenum was normal.                           The cardia and gastric fundus were normal on                            retroflexion. Complications:            No immediate complications. Estimated Blood Loss:     Estimated blood loss: none. Impression:               - Normal esophagus.                           - Normal stomach.                           - Normal examined duodenum.                           - No specimens collected. Recommendation:           - Reflux precautions.                           - Resume previous diet.                           - Continue present medications. Wilhemina Bonito. Marina Goodell, MD 11/27/2016 3:01:39 PM This  report has been signed electronically.

## 2016-11-27 NOTE — Progress Notes (Signed)
Called to room to assist during endoscopic procedure.  Patient ID and intended procedure confirmed with present staff. Received instructions for my participation in the procedure from the performing physician.  

## 2016-11-28 ENCOUNTER — Telehealth: Payer: Self-pay

## 2016-11-28 NOTE — Telephone Encounter (Signed)
  Follow up Call-  Call back number 11/27/2016 08/03/2014  Post procedure Call Back phone  # [phone] hm  Permission to leave phone message Yes Yes  Some recent data might be hidden     Patient questions:  Do you have a fever, pain , or abdominal swelling? No. Pain Score  0 *  Have you tolerated food without any problems? Yes.    Have you been able to return to your normal activities? Yes.    Do you have any questions about your discharge instructions: Diet   No. Medications  No. Follow up visit  No.  Do you have questions or concerns about your Care? No.  Actions: * If pain score is 4 or above: No action needed, pain <4.

## 2016-12-03 ENCOUNTER — Other Ambulatory Visit: Payer: Self-pay | Admitting: Internal Medicine

## 2016-12-05 ENCOUNTER — Encounter: Payer: Self-pay | Admitting: Internal Medicine

## 2016-12-05 ENCOUNTER — Telehealth: Payer: Self-pay

## 2016-12-05 NOTE — Telephone Encounter (Signed)
-----   Message from Hilarie FredricksonJohn N Perry, MD sent at 12/05/2016  2:18 PM EST ----- Regarding: Biopsy result Bonita QuinLinda, Please contact the patient and let him know that the area of concern in his colon was only benign inflammation. Likely a bout of diverticulitis which has resolved. Nothing further to be done at this time. He needs routine colonoscopy in 10 years. I have sent him a letter with this information as well. Thank you

## 2016-12-05 NOTE — Telephone Encounter (Signed)
Spoke with pt and he is aware. 

## 2017-01-01 ENCOUNTER — Other Ambulatory Visit: Payer: Self-pay | Admitting: Internal Medicine

## 2017-01-05 DIAGNOSIS — M79605 Pain in left leg: Secondary | ICD-10-CM | POA: Diagnosis not present

## 2017-01-05 DIAGNOSIS — F411 Generalized anxiety disorder: Secondary | ICD-10-CM | POA: Diagnosis not present

## 2017-01-05 DIAGNOSIS — Z7982 Long term (current) use of aspirin: Secondary | ICD-10-CM | POA: Diagnosis not present

## 2017-01-05 DIAGNOSIS — M25512 Pain in left shoulder: Secondary | ICD-10-CM | POA: Diagnosis not present

## 2017-01-05 DIAGNOSIS — Z882 Allergy status to sulfonamides status: Secondary | ICD-10-CM | POA: Diagnosis not present

## 2017-01-05 DIAGNOSIS — M25511 Pain in right shoulder: Secondary | ICD-10-CM | POA: Diagnosis not present

## 2017-01-05 DIAGNOSIS — Z880 Allergy status to penicillin: Secondary | ICD-10-CM | POA: Diagnosis not present

## 2017-01-05 DIAGNOSIS — M542 Cervicalgia: Secondary | ICD-10-CM | POA: Diagnosis not present

## 2017-01-05 DIAGNOSIS — M79604 Pain in right leg: Secondary | ICD-10-CM | POA: Diagnosis not present

## 2017-01-05 DIAGNOSIS — Z888 Allergy status to other drugs, medicaments and biological substances status: Secondary | ICD-10-CM | POA: Diagnosis not present

## 2017-01-05 DIAGNOSIS — G894 Chronic pain syndrome: Secondary | ICD-10-CM | POA: Diagnosis not present

## 2017-01-05 DIAGNOSIS — Z79891 Long term (current) use of opiate analgesic: Secondary | ICD-10-CM | POA: Diagnosis not present

## 2017-01-05 DIAGNOSIS — M791 Myalgia: Secondary | ICD-10-CM | POA: Diagnosis not present

## 2017-01-05 DIAGNOSIS — F4310 Post-traumatic stress disorder, unspecified: Secondary | ICD-10-CM | POA: Diagnosis not present

## 2017-01-05 DIAGNOSIS — M47816 Spondylosis without myelopathy or radiculopathy, lumbar region: Secondary | ICD-10-CM | POA: Diagnosis not present

## 2017-01-05 DIAGNOSIS — Z79899 Other long term (current) drug therapy: Secondary | ICD-10-CM | POA: Diagnosis not present

## 2017-01-12 DIAGNOSIS — F411 Generalized anxiety disorder: Secondary | ICD-10-CM | POA: Diagnosis not present

## 2017-01-12 DIAGNOSIS — Z79899 Other long term (current) drug therapy: Secondary | ICD-10-CM | POA: Diagnosis not present

## 2017-03-18 ENCOUNTER — Other Ambulatory Visit: Payer: Self-pay | Admitting: Internal Medicine

## 2017-05-19 DIAGNOSIS — F411 Generalized anxiety disorder: Secondary | ICD-10-CM | POA: Diagnosis not present

## 2017-05-22 ENCOUNTER — Other Ambulatory Visit: Payer: Self-pay | Admitting: Internal Medicine

## 2017-05-23 ENCOUNTER — Other Ambulatory Visit: Payer: Self-pay | Admitting: Internal Medicine

## 2017-06-10 ENCOUNTER — Emergency Department (HOSPITAL_COMMUNITY): Payer: Medicare Other

## 2017-06-10 ENCOUNTER — Encounter (HOSPITAL_COMMUNITY): Payer: Self-pay | Admitting: Emergency Medicine

## 2017-06-10 ENCOUNTER — Telehealth: Payer: Self-pay

## 2017-06-10 ENCOUNTER — Encounter: Payer: Self-pay | Admitting: Internal Medicine

## 2017-06-10 ENCOUNTER — Emergency Department (HOSPITAL_COMMUNITY)
Admission: EM | Admit: 2017-06-10 | Discharge: 2017-06-10 | Disposition: A | Discharge status: Home / Self Care | Payer: Medicare Other | Source: Home / Self Care | Attending: Emergency Medicine | Admitting: Emergency Medicine

## 2017-06-10 ENCOUNTER — Ambulatory Visit (INDEPENDENT_AMBULATORY_CARE_PROVIDER_SITE_OTHER): Payer: Medicare Other | Admitting: Internal Medicine

## 2017-06-10 VITALS — BP 138/84 | HR 94 | Temp 102.6°F | Ht 65.0 in | Wt 165.0 lb

## 2017-06-10 DIAGNOSIS — F17210 Nicotine dependence, cigarettes, uncomplicated: Secondary | ICD-10-CM

## 2017-06-10 DIAGNOSIS — A419 Sepsis, unspecified organism: Secondary | ICD-10-CM

## 2017-06-10 DIAGNOSIS — Z79899 Other long term (current) drug therapy: Secondary | ICD-10-CM

## 2017-06-10 DIAGNOSIS — L03113 Cellulitis of right upper limb: Secondary | ICD-10-CM

## 2017-06-10 DIAGNOSIS — Z7982 Long term (current) use of aspirin: Secondary | ICD-10-CM

## 2017-06-10 DIAGNOSIS — I10 Essential (primary) hypertension: Secondary | ICD-10-CM | POA: Insufficient documentation

## 2017-06-10 LAB — COMPREHENSIVE METABOLIC PANEL
ALBUMIN: 4.4 g/dL (ref 3.5–5.0)
ALT: 26 U/L (ref 17–63)
AST: 20 U/L (ref 15–41)
Alkaline Phosphatase: 63 U/L (ref 38–126)
Anion gap: 9 (ref 5–15)
BILIRUBIN TOTAL: 0.7 mg/dL (ref 0.3–1.2)
BUN: 10 mg/dL (ref 6–20)
CHLORIDE: 101 mmol/L (ref 101–111)
CO2: 26 mmol/L (ref 22–32)
Calcium: 8.8 mg/dL — ABNORMAL LOW (ref 8.9–10.3)
Creatinine, Ser: 0.86 mg/dL (ref 0.61–1.24)
GFR calc Af Amer: >60 mL/min (ref >60)
GFR calc non Af Amer: >60 mL/min (ref >60)
GLUCOSE: 116 mg/dL — AB (ref 65–99)
POTASSIUM: 4 mmol/L (ref 3.5–5.1)
SODIUM: 136 mmol/L (ref 135–145)
TOTAL PROTEIN: 7.4 g/dL (ref 6.5–8.1)

## 2017-06-10 LAB — CBC WITH DIFFERENTIAL/PLATELET
Basophils Absolute: 0 10*3/uL (ref 0.0–0.1)
Basophils Relative: 0 %
Eosinophils Absolute: 0 10*3/uL (ref 0.0–0.7)
Eosinophils Relative: 0 %
HEMATOCRIT: 41.9 % (ref 39.0–52.0)
HEMOGLOBIN: 14.4 g/dL (ref 13.0–17.0)
LYMPHS ABS: 2 10*3/uL (ref 0.7–4.0)
LYMPHS PCT: 10 %
MCH: 29 pg (ref 26.0–34.0)
MCHC: 34.4 g/dL (ref 30.0–36.0)
MCV: 84.5 fL (ref 78.0–100.0)
MONO ABS: 1.7 10*3/uL — AB (ref 0.1–1.0)
MONOS PCT: 9 %
NEUTROS ABS: 16.2 10*3/uL — AB (ref 1.7–7.7)
Neutrophils Relative %: 81 %
Platelets: 239 10*3/uL (ref 150–400)
RBC: 4.96 MIL/uL (ref 4.22–5.81)
RDW: 14.4 % (ref 11.5–15.5)
WBC: 20 10*3/uL — ABNORMAL HIGH (ref 4.0–10.5)

## 2017-06-10 LAB — PROTIME-INR
INR: 1.04
Prothrombin Time: 13.6 seconds (ref 11.4–15.2)

## 2017-06-10 LAB — I-STAT CG4 LACTIC ACID, ED
Lactic Acid, Venous: 0.73 mmol/L (ref 0.5–1.9)
Lactic Acid, Venous: 2.15 mmol/L (ref 0.5–1.9)

## 2017-06-10 MED ORDER — VANCOMYCIN HCL IN DEXTROSE 1-5 GM/200ML-% IV SOLN: 1000.0000 mg | Freq: Once | INTRAVENOUS | Status: DC

## 2017-06-10 MED ORDER — IBUPROFEN 200 MG PO TABS
600.0000 mg | ORAL_TABLET | Freq: Once | ORAL | Status: AC
Start: 2017-06-10 — End: 2017-06-10
  Administered 2017-06-10: 600 mg via ORAL
  Filled 2017-06-10: qty 3

## 2017-06-10 MED ORDER — SODIUM CHLORIDE 0.9 % IV BOLUS (SEPSIS)
1000.0000 mL | Freq: Once | INTRAVENOUS | Status: AC
  Administered 2017-06-10: 1000 mL via INTRAVENOUS

## 2017-06-10 MED ORDER — ACETAMINOPHEN 500 MG PO TABS
1000.0000 mg | ORAL_TABLET | Freq: Once | ORAL | Status: AC
  Administered 2017-06-10: 1000 mg via ORAL
  Filled 2017-06-10: qty 2

## 2017-06-10 MED ORDER — CLINDAMYCIN PHOSPHATE 600 MG/50ML IV SOLN: 600.0000 mg | Freq: Once | INTRAVENOUS | Status: DC

## 2017-06-10 MED ORDER — ACETAMINOPHEN 325 MG PO TABS
650.0000 mg | ORAL_TABLET | Freq: Once | ORAL | Status: AC | PRN
  Administered 2017-06-10: 650 mg via ORAL
  Filled 2017-06-10: qty 2

## 2017-06-10 MED ORDER — DOXYCYCLINE HYCLATE 100 MG PO CAPS: 100.0000 mg | ORAL_CAPSULE | Freq: Two times a day (BID) | ORAL | 0 refills | Status: DC

## 2017-06-10 MED ORDER — CLINDAMYCIN PHOSPHATE 900 MG/50ML IV SOLN
900.0000 mg | Freq: Once | INTRAVENOUS | Status: AC
  Administered 2017-06-10: 900 mg via INTRAVENOUS
  Filled 2017-06-10: qty 50

## 2017-06-10 MED ORDER — SODIUM CHLORIDE 0.9 % IV SOLN
1500.0000 mg | Freq: Once | INTRAVENOUS | Status: AC
Start: 2017-06-10 — End: 2017-06-10
  Administered 2017-06-10: 1500 mg via INTRAVENOUS
  Filled 2017-06-10: qty 1500

## 2017-06-10 NOTE — Patient Instructions (Addendum)
Please go to the [facility] ED now due to right arm cellulitis and probable sepsis  Please continue all other medications as before, and refills have been done if requested.  Please have the pharmacy call with any other refills you may need.  Please keep your appointments with your specialists as you may have planned

## 2017-06-10 NOTE — Telephone Encounter (Signed)
Pt has been made aware that the handicap placard form is filld out and ready for pick up At front desk

## 2017-06-10 NOTE — Assessment & Plan Note (Signed)
Has obvious bacterial cellulitis and possible abscess right mid post arm though not clear to me on exam  Needs to go to ED now for further evaluation and probable admission

## 2017-06-10 NOTE — ED Notes (Signed)
Dr. Fayrene FearingJames John's nurse has just phoned to let us know pt. Is being sent here by p.o.v. For c/o cellulitis of right forearm with temperature of 102.

## 2017-06-10 NOTE — Assessment & Plan Note (Signed)
Probable based on + temp, HR > 90 and very likely elevated WBC (though this is to be determined);  Tx is as above

## 2017-06-10 NOTE — Progress Notes (Signed)
Subjective:    Patient ID: Phillip Mullen, male    DOB: [birth year], 51 y.o.   MRN: [id number]  HPI  Here to f/u with c/o rapidly worsening right pain now severe after some type of insect bite to right mid forearm posteriorly.  Works for the [employer], and a former Armed forces operational officermiliatary corpman helpfully place an ink line about 1.5 cm surrounding the bite site, but then "ballooned" overnight, now with whole arm below the elbow and most of hand with large 2-3+ swelling, red, tender without drainage.  No red streaks,  Point of most pain and sweling seems to be the insect bite.   + fever and + signs of probable sepsis Past Medical History:  Diagnosis Date  . Allergy   . Anxiety   . Bilateral carpal tunnel syndrome 02/10/2013   Per Dr Amanda PeaGramig  . GERD (gastroesophageal reflux disease)   . Headache(784.0)   . Hypertension   . Osteoarthritis   . Osteoporosis   . Seizures (HCC)    1 seisure at age 89 per pt   Past Surgical History:  Procedure Laterality Date  . ABCESS DRAINAGE     Infected finger  . INGUINAL HERNIA REPAIR    . TONSILLECTOMY      reports that he has been smoking Cigarettes.  He has been smoking about 2.00 packs per day. He has never used smokeless tobacco. He reports that he does not drink alcohol or use drugs. family history is not on file. Allergies  Allergen Reactions  . Penicillins     REACTION: hives  . Sulfonamide Derivatives     Pt not sure of the reaction   Current Outpatient Prescriptions on File Prior to Visit  Medication Sig Dispense Refill  . ALPRAZolam (XANAX) 1 MG tablet Take 1 tablet (1 mg total) by mouth 3 (three) times daily as needed. 90 tablet 0  . aspirin 81 MG tablet Take 81 mg by mouth daily.      . cetirizine (ZYRTEC) 10 MG tablet TAKE 1 TABLET BY MOUTH ONCE DAILY 30 tablet 3  . clonazePAM (KLONOPIN) 1 MG tablet Take 1 mg by mouth 3 (three) times daily.    . magnesium hydroxide (MILK OF MAGNESIA) 800 MG/5ML suspension Take 15 mLs by mouth daily as  needed.      . mirtazapine (REMERON) 15 MG tablet Take 15 mg by mouth as needed.    . mometasone (NASONEX) 50 MCG/ACT nasal spray Place 2 sprays into the nose daily. 17 g 11  . omeprazole (PRILOSEC) 20 MG capsule Take 1 capsule (20 mg total) by mouth daily. 30 capsule 11  . oxyCODONE (OXY IR/ROXICODONE) 5 MG immediate release tablet Take 1 tablet (5 mg total) by mouth every 4 (four) hours as needed. Fill on or after 11/10/14 120 tablet 0  . OxyCODONE (OXYCONTIN) 40 mg T12A 12 hr tablet Take 1 tablet (40 mg total) by mouth 3 (three) times daily. To fill on or after 11/10/2014 90 tablet 0  . tamsulosin (FLOMAX) 0.4 MG CAPS capsule TAKE 1 CAPSULE BY MOUTH ONCE DAILY 30 capsule 3   Current Facility-Administered Medications on File Prior to Visit  Medication Dose Route Frequency Provider Last Rate Last Dose  . 0.9 %  sodium chloride infusion  500 mL Intravenous Continuous Hilarie FredricksonPerry, John N, MD       Review of Systems All otherwise neg per pt     Objective:   Physical Exam BP 138/84   Pulse 94   Temp (!)  102.6 F (39.2 C)   Ht 5\' 5"  (1.651 m)   Wt 165 lb (74.8 kg)   SpO2 99%   BMI 27.46 kg/m  VS noted,  Constitutional: Pt appears in NAD HENT: Head: NCAT.  Right Ear: External ear normal.  Left Ear: External ear normal.  Eyes: . Pupils are equal, round, and reactive to light. Conjunctivae and EOM are normal Nose: without d/c or deformity Neck: Neck supple. Gross normal ROM Cardiovascular: Normal rate and regular rhythm.   Pulmonary/Chest: Effort normal and breath sounds without rales or wheezing.   Skin" arm below the elbow and most of hand with large 2-3+ swelling, red, tender without drainage Neurological: Pt is alert. At baseline orientation, motor grossly intact Skin: Skin is warm. No rashes, other new lesions, no LE edema Psychiatric: Pt behavior is normal without agitation         Assessment & Plan:

## 2017-06-10 NOTE — Discharge Instructions (Signed)
Please take all of your antibiotics until finished!   You may develop abdominal discomfort or diarrhea from the antibiotic.  You may help offset this with probiotics which you can buy or get in yogurt. Do not eat  or take the probiotics until 2 hours after your antibiotic.   Return to the ED tomorrow to check your infection and make sure it is not spreading. Return to the ED sooner if any concerning signs or symptoms develop such as worsening fevers, streaking of your rash, altered mental status, nausea or vomiting.

## 2017-06-10 NOTE — ED Provider Notes (Signed)
Medical screening examination/treatment/procedure(s) were conducted as a shared visit with non-physician practitioner(s) and myself.  I personally evaluated the patient during the encounter. Briefly, the patient is a 51 yo right hand dominant male here with fever, rapidly worsening cellulitis of right forearm. No open wounds. No focal fluctuance to suggest abscess. No TTP beyond areas of erythema, crepitance, or evidence to suggest deep infection/nec fasc, with normal Na, no hypotension. BSUS shows superficial but significant cellulitis w/o abscess or apparent tenosynovitis. Will give Clinda, admit given rapid progression, significant fever, leukocytosis of 20k. BCx sent.   EMERGENCY DEPARTMENT US SOFT TISSUE INTERPRETATION "Study: Limited Soft Tissue Ultrasound"  INDICATIONS: Pain and Soft tissue infection Multiple views of the body part were obtained in real-time with a multi-frequency linear probe  PERFORMED BY: Myself IMAGES ARCHIVED?: Yes SIDE:Right  BODY PART:Forearm INTERPRETATION:  No abcess noted and Cellulitis present     Shaune PollackIsaacs, Caspian Deleonardis, MD 06/10/17 1636

## 2017-06-10 NOTE — ED Notes (Signed)
Temp rechecked 102.0 orally

## 2017-06-10 NOTE — ED Provider Notes (Signed)
[facility]-EMERGENCY DEPT Provider Note   CSN: [id number] Arrival date & time: 06/10/17  1440     History   Chief Complaint Chief Complaint  Patient presents with  . Cellulitis  . Fever    HPI Phillip Mullen is a 51 y.o. male with history of anxiety, GERD, HTN, osteoarthritis, and osteoporosis who presents today with chief complaint acute onset, progressively worsening redness and tenderness to the dorsal aspect of the right forearm. He states that 2 days ago he was mowing his lawn and bent down, touching a berry bush and breaking the skin in multiple areas via the thorns. He did not notice any erythema at that time, but noted it developed several hours later. The next day, a coworker marked the area with a pen. He noted the area worsening and so presented to his primary care physician Dr. Oliver Barre today who sent him to the ED for possible sepsis workup and IV antibiotics. He notes associated nausea, tenderness to the area worsened with palpation and movement, and fevers. Has tried Benadryl and aspirin with little relief. Denies shortness of breath, abdominal pain, vomiting, diarrhea, urinary symptoms, chest pain, or facial swelling. He endorses smoking cigarettes, but denies drinking alcohol or IV drug use.  The history is provided by the patient.    Past Medical History:  Diagnosis Date  . Allergy   . Anxiety   . Bilateral carpal tunnel syndrome 02/10/2013   Per Dr Amanda Pea  . GERD (gastroesophageal reflux disease)   . Headache(784.0)   . Hypertension   . Osteoarthritis   . Osteoporosis   . Seizures (HCC)    1 seisure at age 1 per pt    Patient Active Problem List   Diagnosis Date Noted  . Right arm cellulitis 06/10/2017  . Sepsis (HCC) 06/10/2017  . Boil 09/14/2016  . Abdominal pain 09/09/2016  . Loss of weight 09/09/2016  . Dysphagia 09/09/2016  . Fatigue 09/09/2016  . Left knee pain 11/17/2015  . Hyperlipidemia 11/16/2015  . Rash and nonspecific skin eruption  12/25/2014  . Bladder neck obstruction 06/01/2014  . Routine health maintenance 09/09/2013  . Benign prostatic hypertrophy (BPH) with incomplete bladder emptying 07/29/2013  . Bilateral carpal tunnel syndrome 02/10/2013  . Other malaise and fatigue 02/10/2013  . Shoulder pain, left 04/09/2012  . Nausea with vomiting 02/07/2010  . BACK PAIN, CHRONIC 11/21/2008  . ALLERGIC RHINITIS, CHRONIC 02/23/2008  . Anxiety state 07/19/2007  . Essential hypertension 07/19/2007  . GERD 07/19/2007  . OSTEOARTHRITIS 07/19/2007  . Headache(784.0) 07/19/2007    Past Surgical History:  Procedure Laterality Date  . ABCESS DRAINAGE     Infected finger  . INGUINAL HERNIA REPAIR    . TONSILLECTOMY         Home Medications    Prior to Admission medications   Medication Sig Start Date End Date Taking? Authorizing Provider  aspirin 81 MG tablet Take 81 mg by mouth daily.     Yes [provider]  cetirizine (ZYRTEC) 10 MG tablet TAKE 1 TABLET BY MOUTH ONCE DAILY 05/25/17  Yes [name], MD  diazepam (VALIUM) 10 MG tablet Take 15 mg by mouth 2 (two) times daily.   Yes [provider]  magnesium hydroxide (MILK OF MAGNESIA) 800 MG/5ML suspension Take 15 mLs by mouth daily as needed for constipation.    Yes [provider]  mirtazapine (REMERON) 15 MG tablet Take 15 mg by mouth as needed (depression).  09/22/16  Yes [provider]  mometasone (NASONEX) 50 MCG/ACT nasal spray Place 2 sprays into the nose daily. Patient taking differently: Place 1 spray into the nose daily.  09/09/16  Yes [name], [name], MD  Multiple Vitamins-Minerals (MULTIVITAMIN GUMMIES ADULT) CHEW Chew 1 Dose by mouth daily.   Yes [provider]  omeprazole (PRILOSEC) 20 MG capsule Take 1 capsule (20 mg total) by mouth daily. 09/29/16  Yes [name], [name], PA-C  oxyCODONE (OXY IR/ROXICODONE) 5 MG immediate release tablet Take 1 tablet (5 mg total) by mouth every 4 (four) hours as needed.  Fill on or after 11/10/14 Patient taking differently: Take 5 mg by mouth every 4 (four) hours as needed for breakthrough pain. Fill on or after 11/10/14 10/11/14  Yes [name], [name], MD  OxyCODONE (OXYCONTIN) 40 mg T12A 12 hr tablet Take 1 tablet (40 mg total) by mouth 3 (three) times daily. To fill on or after 11/10/2014 10/11/14  Yes [name], [name], MD  tamsulosin (FLOMAX) 0.4 MG CAPS capsule TAKE 1 CAPSULE BY MOUTH ONCE DAILY Patient taking differently: TAKE 0.4 mg BY MOUTH ONCE DAILY 05/25/17  Yes [name], [name], MD  ALPRAZolam Prudy Feeler(XANAX) 1 MG tablet Take 1 tablet (1 mg total) by mouth 3 (three) times daily as needed. Patient not taking: Reported on 06/10/2017 08/12/16   Veryl Speakalone, Gregory D, FNP  doxycycline (VIBRAMYCIN) 100 MG capsule Take 1 capsule (100 mg total) by mouth 2 (two) times daily. 06/10/17 06/20/17  Jeanie SewerFawze, Shavonna Corella A, PA-C    Family History Family History  Problem Relation Age of Onset  . Colon cancer Neg Hx   . Esophageal cancer Neg Hx   . Rectal cancer Neg Hx   . Stomach cancer Neg Hx     Social History Social History  Substance Use Topics  . Smoking status: Current Every Day Smoker    Packs/day: 2.00    Types: Cigarettes  . Smokeless tobacco: Never Used     Comment: form given 09-29-16  . Alcohol use No     Allergies   Penicillins and Sulfonamide derivatives   Review of Systems Review of Systems  Constitutional: Positive for chills and fever.  HENT: Negative for facial swelling.   Respiratory: Negative for shortness of breath.   Cardiovascular: Negative for chest pain.  Gastrointestinal: Positive for nausea. Negative for abdominal pain and diarrhea.  Genitourinary: Negative for dysuria and hematuria.  Skin: Positive for color change.  All other systems reviewed and are negative.    Physical Exam Updated Vital Signs BP 124/69   Pulse 93   Temp (!) 102 F (38.9 C) (Oral)   Resp 16   SpO2 97%   Physical Exam  Constitutional: He is oriented to person, place, and  time. He appears well-developed and well-nourished. No distress.  HENT:  Head: Normocephalic and atraumatic.  No swelling of the lips or tongue, airway is patent  Eyes: Conjunctivae are normal. Right eye exhibits no discharge. Left eye exhibits no discharge.  Neck: No JVD present. No tracheal deviation present.  Cardiovascular: Normal rate and regular rhythm.   Pulmonary/Chest: Effort normal and breath sounds normal.  Abdominal: Soft. Bowel sounds are normal. He exhibits no distension. There is no tenderness.  Musculoskeletal: He exhibits no edema.  Decreased ROM to the right wrist. Good grip strength and normal sensation. 5/5 strength of the BUE major muscle groups with flexion and extension against resistance. Generalized tenderness to palpation of the wrist and dorsum of the hand with no deformity or crepitus noted  Neurological:  He is alert and oriented to person, place, and time.  Skin: Skin is warm and dry. Capillary refill takes less than 2 seconds. There is erythema.  See attached images. Erythema, tenderness to the dorsal aspect of the right forearm approximately 20cm x 10cm. Areas of spread have been marked. No fluctuance or drainage noted. There is streaking up the inner upper arm suggesting lymphangitis. Multiple small scabs to the forearm noted.   Psychiatric: He has a normal mood and affect. His behavior is normal.  Nursing note and vitals reviewed.   Dorsum of right forearm   ED Treatments / Results  Labs (all labs ordered are listed, but only abnormal results are displayed) Labs Reviewed  COMPREHENSIVE METABOLIC PANEL - Abnormal; Notable for the following:       Result Value   Glucose, Bld 116 (*)    Calcium 8.8 (*)    All other components within normal limits  CBC WITH DIFFERENTIAL/PLATELET - Abnormal; Notable for the following:    WBC 20.0 (*)    Neutro Abs 16.2 (*)    Monocytes Absolute 1.7 (*)    All other components within normal limits  I-STAT CG4 LACTIC ACID,  ED - Abnormal; Notable for the following:    Lactic Acid, Venous 2.15 (*)    All other components within normal limits  CULTURE, BLOOD (ROUTINE X 2)  CULTURE, BLOOD (ROUTINE X 2)  PROTIME-INR  I-STAT CG4 LACTIC ACID, ED    EKG  EKG Interpretation None       Radiology No results found.  Procedures Procedures (including critical care time)  Medications Ordered in ED Medications  vancomycin (VANCOCIN) 1,500 mg in sodium chloride 0.9 % 500 mL IVPB (1,500 mg Intravenous New Bag/Given 06/10/17 1756)  acetaminophen (TYLENOL) tablet 650 mg (650 mg Oral Given 06/10/17 1504)  acetaminophen (TYLENOL) tablet 1,000 mg (1,000 mg Oral Given 06/10/17 1559)  sodium chloride 0.9 % bolus 1,000 mL (0 mLs Intravenous Stopped 06/10/17 1744)  clindamycin (CLEOCIN) IVPB 900 mg (0 mg Intravenous Stopped 06/10/17 1656)  ibuprofen (ADVIL,MOTRIN) tablet 600 mg (600 mg Oral Given 06/10/17 1800)  sodium chloride 0.9 % bolus 1,000 mL (1,000 mLs Intravenous New Bag/Given 06/10/17 1826)     Initial Impression / Assessment and Plan / ED Course  I have reviewed the triage vital signs and the nursing notes.  Pertinent labs & imaging results that were available during my care of the patient were reviewed by me and considered in my medical decision making (see chart for details).      Patient with cellulitis of the dorsum of the right forearm. Febrile while in the ED with some improvement with Tylenol and ibuprofen. No abscess appreciated on bedside ultrasound by Dr. Erma Heritage. Neurovascularly intact. Has a leukocytosis of 20, no significant electrolyte abnormalities and renal function is normal. Initiated clindamycin and fluids as patient is allergic to penicillins. Spoke with hospitalist Dr. Jerral Ralph, who agrees to bring the patient into the hospital for management of cellulitis.  7:49 PM Patient is adamant that he does not want to be brought into the hospital so that he may care for his pets at home. States he has no  one to help him care for his pets. Had an extensive discussion with patient with Dr. Jerral Ralph, and explained the risks of leaving the hospital. Patient agrees to return to the ED tomorrow for wound check to ensure improving condition. Will also give loading dose of vancomycin here in the ED today. Will discharge with Bactrim and  Keflex. Discussed indications for return to the ED sooner. Pt verbalized understanding of and agreement with plan and is safe for discharge home at this time. Patient seen and evaluated by Dr. Erma Heritage who agrees with plan and assessment.  7:49 PM Repeat lactate elevated at 2.15. At this point in time I do recommend inpatient treatment of the cellulitis. Patient remains adamant that he must go home and states "I need to be free from this place". We had an extensive discussion regarding the risks of leaving. I do believe that patient has capacity to make this decision and ability to understand our conversation. Patient is leaving AMA with doxycycline and instructions to return for wound check tomorrow.  Final Clinical Impressions(s) / ED Diagnoses   Final diagnoses:  Cellulitis of right upper extremity    New Prescriptions New Prescriptions   DOXYCYCLINE (VIBRAMYCIN) 100 MG CAPSULE    Take 1 capsule (100 mg total) by mouth 2 (two) times daily.     Jeanie Sewer, PA-C 06/10/17 2045    Shaune Pollack, MD 06/11/17 [phone]

## 2017-06-10 NOTE — ED Triage Notes (Signed)
Pt c/o severe right arm pain and redness to right arm onset 2 days ago after getting thorns from berry bush stuck in his arm. Pt states he believes he removed all of the thorns. Hot, erythematous area of cellulitis to right forearm. Fever 103.1 F orally.

## 2017-06-10 NOTE — Consult Note (Signed)
[facility]       PATIENT DETAILS Name: Phillip Mullen Age: 51 y.o. Sex: male Date of Birth: 08-02-1966 Admit Date: 06/10/2017 ZOX:WRUE, Len Blalock, MD Requesting MD: Shaune Pollack, MD   Date of consultation: 06/10/17   REASON FOR CONSULTATION:  Inpatient Admission for right forearm cellulitis. (Patient refuses admit and wants to go home)   HISTORY OF PRESENT ILLNESS: Phillip Mullen is an 51 y.o. male with past medical history of BPH, seasonal allergies, ongoing tobacco abuse presented to the ED for evaluation of right forearm pain, swelling and erythema. Per patient, he was working on his garden yesterday, and apparently a few thorns pricked his right forearm. This morning, he found that his right forearm was erythematous, tender and slightly swollen. He subsequently saw his primary care practitioner, who referred him to the emergency room. In the ED, he was found to have fever, along with a leukocytosis of 20,000. M.D. performed a bedside ultrasound, there was no drainable abscess. I was subsequently consulted for inpatient admission.   During my evaluation, patient claimed that he has multiple animals at home that he needs to look after, and there is really no one else to look after the animals if he is hospitalized. He expressed his desire to go home rather than stay in the hospital. He came to me that his right forearm was already less erythematous, and swollen than before. He also claimed that his pain was also improving. I asked him if he would consider staying in the hospital overnight/for a few days for IV antibiotics, he did not want to stay-and would rather go home with oral antibiotics and see how his forearm felt.  I subsequently notified ED PA-C Michela Pitcher who was taking care of the patient here in the emergency room. The two of [place], then went to the patient's room, and asked him if he would reconsider and agree to stay for IV antibiotics.  However the patient was insisting on going home with oral antibiotics.  Patient was informed about the risks including worsening infection, sepsis leading to life threatening and life disabling (amputation) effects, patient was fully aware and accepting of this risk. He in fact said that he is aware of worsening infection and that this infection could spread beyond the affected limb to his chest and to the other limb.   There is no history of nausea, vomiting, diarrhea.  There is history of fever.  REVIEW OF SYSTEMS:  Constitutional:   No  weight loss, night sweats, chills, fatigue.  HEENT:    No headaches, Difficulty swallowing,Tooth/dental problems,Sore throat  Cardio-vascular: No chest pain,  Orthopnea, PND, swelling in lower extremities, anasarca,  dizziness, palpitations  GI:  No heartburn, indigestion, abdominal pain, nausea, vomiting, diarrhea, change in   bowel habits  Resp: No shortness of breath with exertion or at rest.  No cough  Skin:  no rash or lesions.  GU:  no dysuria, change in color of urine, no urgency or frequency.    Musculoskeletal: No joint pain or swelling.  No decreased range of motion.  No back pain.  Psych: No change in mood or affect. No depression or anxiety.  No memory loss.   ALLERGIES:   Allergies  Allergen Reactions  . Penicillins     Has patient had a PCN reaction causing immediate rash, facial/tongue/throat swelling, SOB or lightheadedness with hypotension: Unknown Has patient had a PCN reaction causing severe rash involving mucus membranes or skin necrosis: Unknown Has  patient had a PCN reaction that required hospitalization: No Has patient had a PCN reaction occurring within the last 10 years: No If all of the above answers are "NO", then may proceed with Cephalosporin use.   . Sulfonamide Derivatives Hives    Pt not sure of the reaction    PAST MEDICAL HISTORY: Past Medical History:  Diagnosis Date  . Allergy   . Anxiety    . Bilateral carpal tunnel syndrome 02/10/2013   Per Dr Amanda Pea  . GERD (gastroesophageal reflux disease)   . Headache(784.0)   . Hypertension   . Osteoarthritis   . Osteoporosis   . Seizures (HCC)    1 seisure at age 58 per pt    PAST SURGICAL HISTORY: Past Surgical History:  Procedure Laterality Date  . ABCESS DRAINAGE     Infected finger  . INGUINAL HERNIA REPAIR    . TONSILLECTOMY      MEDICATIONS AT HOME: Prior to Admission medications   Medication Sig Start Date End Date Taking? Authorizing Provider  aspirin 81 MG tablet Take 81 mg by mouth daily.     Yes [provider]  cetirizine (ZYRTEC) 10 MG tablet TAKE 1 TABLET BY MOUTH ONCE DAILY 05/25/17  Yes [name], MD  diazepam (VALIUM) 10 MG tablet Take 15 mg by mouth 2 (two) times daily.   Yes [provider]  magnesium hydroxide (MILK OF MAGNESIA) 800 MG/5ML suspension Take 15 mLs by mouth daily as needed for constipation.    Yes [provider]  mirtazapine (REMERON) 15 MG tablet Take 15 mg by mouth as needed (depression).  09/22/16  Yes [provider]  mometasone (NASONEX) 50 MCG/ACT nasal spray Place 2 sprays into the nose daily. Patient taking differently: Place 1 spray into the nose daily.  09/09/16  Yes [name], MD  Multiple Vitamins-Minerals (MULTIVITAMIN GUMMIES ADULT) CHEW Chew 1 Dose by mouth daily.   Yes [provider]  omeprazole (PRILOSEC) 20 MG capsule Take 1 capsule (20 mg total) by mouth daily. 09/29/16  Yes [name], [name], PA-C  oxyCODONE (OXY IR/ROXICODONE) 5 MG immediate release tablet Take 1 tablet (5 mg total) by mouth every 4 (four) hours as needed. Fill on or after 11/10/14 Patient taking differently: Take 5 mg by mouth every 4 (four) hours as needed for breakthrough pain. Fill on or after 11/10/14 10/11/14  Yes [name], MD  OxyCODONE (OXYCONTIN) 40 mg T12A 12 hr tablet Take 1 tablet (40 mg total) by mouth 3 (three) times daily. To fill on or  after 11/10/2014 10/11/14  Yes [name], MD  tamsulosin (FLOMAX) 0.4 MG CAPS capsule TAKE 1 CAPSULE BY MOUTH ONCE DAILY Patient taking differently: TAKE 0.4 mg BY MOUTH ONCE DAILY 05/25/17  Yes [name], MD  ALPRAZolam Prudy Feeler) 1 MG tablet Take 1 tablet (1 mg total) by mouth 3 (three) times daily as needed. Patient not taking: Reported on 06/10/2017 08/12/16   [name], FNP    FAMILY HISTORY: Family History  Problem Relation Age of Onset  . Colon cancer Neg Hx   . Esophageal cancer Neg Hx   . Rectal cancer Neg Hx   . Stomach cancer Neg Hx     SOCIAL HISTORY:  reports that he has been smoking Cigarettes.  He has been smoking about 2.00 packs per day. He has never used smokeless tobacco. He reports that he does not drink alcohol or use drugs.  PHYSICAL EXAM: Blood pressure 123/63,  pulse 84, temperature (!) 102 F (38.9 C), temperature source Oral, resp. rate 20, SpO2 95 %. General appearance :Awake, alert, not in any distress. Speech Clear. Eyes:, pupils equally reactive to light and accomodation,no scleral icterus. HEENT: Atraumatic and Normocephalic Neck: supple, no JVD. No cervical lymphadenopathy. Resp:Good air entry bilaterally, no added sounds  CVS: S1 S2 regular, no murmurs.  GI: Bowel sounds present, Non tender and not distended with no gaurding, rigidity or rebound.No organomegaly Extremities:  Right forearm-dorsal aspect: With a area of erythema (approximately 5 cm) and some mild swelling. This area is slightly tender. He has numerous punctate puncture marks in his forearm.There is some lymphangitic streaking up to his upper arm. However there is no fluctuant area. He is easily able to move his elbow and his wrist without any limitation in range of motion. The form does not appear tense or exquisitely tender. Neurology:  speech clear,Non focal, sensation is grossly intact. Psychiatric: Normal judgment and insight. Alert and oriented x 3. Normal  mood. Musculoskeletal:gait appears to be normal.No digital cyanosis Skin:No Rash, warm and dry Wounds:N/A  Data reviewed:   I have personally reviewed following labs and imaging studies  LABORATORY DATA: CBC:  Recent Labs Lab 06/10/17 1500  WBC 20.0*  NEUTROABS 16.2*  HGB 14.4  HCT 41.9  MCV 84.5  PLT 239    Basic Metabolic Panel:  Recent Labs Lab 06/10/17 1500  NA 136  K 4.0  CL 101  CO2 26  GLUCOSE 116*  BUN 10  CREATININE 0.86  CALCIUM 8.8*   GFR Estimated Creatinine Clearance: 93.9 mL/min (by C-G formula based on SCr of 0.86 mg/dL).  Liver Function Tests:  Recent Labs Lab 06/10/17 1500  AST 20  ALT 26  ALKPHOS 63  BILITOT 0.7  PROT 7.4  ALBUMIN 4.4   No results for input(s): LIPASE, AMYLASE in the last 168 hours. No results for input(s): AMMONIA in the last 168 hours.  Coagulation profile  Recent Labs Lab 06/10/17 1500  INR 1.04    Cardiac Enzymes: No results for input(s): CKTOTAL, CKMB, CKMBINDEX, TROPONINI in the last 168 hours.  BNP: Invalid input(s): POCBNP  CBG: No results for input(s): GLUCAP in the last 168 hours.  D-Dimer No results for input(s): DDIMER in the last 72 hours.  Hgb A1c No results for input(s): HGBA1C in the last 72 hours.  Lipid Profile No results for input(s): CHOL, HDL, LDLCALC, TRIG, CHOLHDL, LDLDIRECT in the last 72 hours.  Thyroid function studies No results for input(s): TSH, T4TOTAL, T3FREE, THYROIDAB in the last 72 hours.  Invalid input(s): FREET3  Anemia work up No results for input(s): VITAMINB12, FOLATE, FERRITIN, TIBC, IRON, RETICCTPCT in the last 72 hours.  Urinalysis    Component Value Date/Time   COLORURINE YELLOW 09/09/2016 1452   APPEARANCEUR CLEAR 09/09/2016 1452   LABSPEC <=1.005 (A) 09/09/2016 1452   PHURINE 6.0 09/09/2016 1452   GLUCOSEU NEGATIVE 09/09/2016 1452   HGBUR NEGATIVE 09/09/2016 1452   BILIRUBINUR NEGATIVE 09/09/2016 1452   KETONESUR NEGATIVE 09/09/2016 1452    UROBILINOGEN 0.2 09/09/2016 1452   NITRITE NEGATIVE 09/09/2016 1452   LEUKOCYTESUR NEGATIVE 09/09/2016 1452    Sepsis Labs Lactic Acid, Venous    Component Value Date/Time   LATICACIDVEN 0.73 06/10/2017 1514    Microbiology No results found for this or any previous visit (from the past 240 hour(s)).   Radiological Exams on Admission: No results found.  Inpatient Medications:   Anti-infectives    Start  Dose/Rate Route Frequency Ordered Stop   06/10/17 1730  vancomycin (VANCOCIN) IVPB 1000 mg/200 mL premix  Status:  Discontinued     1,000 mg 200 mL/hr over 60 Minutes Intravenous  Once 06/10/17 1716 06/10/17 1721   06/10/17 1730  vancomycin (VANCOCIN) 1,500 mg in sodium chloride 0.9 % 500 mL IVPB     1,500 mg 250 mL/hr over 120 Minutes Intravenous  Once 06/10/17 1721     06/10/17 1600  clindamycin (CLEOCIN) IVPB 600 mg  Status:  Discontinued     600 mg 100 mL/hr over 30 Minutes Intravenous  Once 06/10/17 1549 06/10/17 1550   06/10/17 1600  clindamycin (CLEOCIN) IVPB 900 mg     900 mg 100 mL/hr over 30 Minutes Intravenous  Once 06/10/17 1550 06/10/17 1656     Scheduled Meds: . ibuprofen  600 mg Oral Once   Continuous Infusions: . vancomycin       Impression/Recommendations: Right forearm cellulitis with systemic inflammatory response syndrome: Ideally needs inpatient admission and IV antibiotics, however he claims he has numerous pets/animals at home that he needs to take care of and that no one else can take care of for him. He is aware of the risks of worsening hand infection leading to sepsis, organ failure, loss of limb and death in some circumstance. He is willing to come back to fast track tomorrow for a wound check/follow-up. I have implored him to come back to the emergency room if he experiences any worsening of his forearm or if he has persistent fever.  I would obviously recommend that this patient be sent home on appropriate oral antimicrobial therapy  (Bactrim, would use an agent like Bactrim, doxycycline, or even clindamycin), he be given instructions to come back to the emergency room right away if he experiences worsening of his symptoms. If possible, he should either follow up with his primary care practitioner tomorrow, or brought back to the fast track area in the emergency room for a follow-up. I unfortunately have no further recommendations at this time.  Thank you for letting me participate in the care of this patient.  Total time spent 45 minutes.  Hallis Meditz [facility] Pager [phone]  If 7PM-7AM, please contact night-coverage [email or web address] Password [email or web address] 06/10/2017, 5:22 PM

## 2017-06-11 ENCOUNTER — Encounter (HOSPITAL_COMMUNITY): Payer: Self-pay

## 2017-06-11 ENCOUNTER — Inpatient Hospital Stay (HOSPITAL_COMMUNITY)
Admission: EM | Admit: 2017-06-11 | Discharge: 2017-06-13 | DRG: 603 | Disposition: A | Discharge status: Home / Self Care | Payer: Medicare Other | Attending: Internal Medicine | Admitting: Internal Medicine

## 2017-06-11 DIAGNOSIS — T402X5A Adverse effect of other opioids, initial encounter: Secondary | ICD-10-CM | POA: Diagnosis present

## 2017-06-11 DIAGNOSIS — Z79899 Other long term (current) drug therapy: Secondary | ICD-10-CM

## 2017-06-11 DIAGNOSIS — R338 Other retention of urine: Secondary | ICD-10-CM | POA: Diagnosis present

## 2017-06-11 DIAGNOSIS — D72829 Elevated white blood cell count, unspecified: Secondary | ICD-10-CM

## 2017-06-11 DIAGNOSIS — K5903 Drug induced constipation: Secondary | ICD-10-CM | POA: Diagnosis not present

## 2017-06-11 DIAGNOSIS — Z880 Allergy status to penicillin: Secondary | ICD-10-CM

## 2017-06-11 DIAGNOSIS — M62838 Other muscle spasm: Secondary | ICD-10-CM | POA: Diagnosis present

## 2017-06-11 DIAGNOSIS — F17200 Nicotine dependence, unspecified, uncomplicated: Secondary | ICD-10-CM | POA: Diagnosis present

## 2017-06-11 DIAGNOSIS — M1990 Unspecified osteoarthritis, unspecified site: Secondary | ICD-10-CM | POA: Diagnosis present

## 2017-06-11 DIAGNOSIS — K219 Gastro-esophageal reflux disease without esophagitis: Secondary | ICD-10-CM | POA: Diagnosis present

## 2017-06-11 DIAGNOSIS — Y93H2 Activity, gardening and landscaping: Secondary | ICD-10-CM | POA: Diagnosis not present

## 2017-06-11 DIAGNOSIS — Z882 Allergy status to sulfonamides status: Secondary | ICD-10-CM | POA: Diagnosis not present

## 2017-06-11 DIAGNOSIS — Z7982 Long term (current) use of aspirin: Secondary | ICD-10-CM

## 2017-06-11 DIAGNOSIS — M549 Dorsalgia, unspecified: Secondary | ICD-10-CM | POA: Diagnosis present

## 2017-06-11 DIAGNOSIS — G8929 Other chronic pain: Secondary | ICD-10-CM | POA: Diagnosis not present

## 2017-06-11 DIAGNOSIS — K5900 Constipation, unspecified: Secondary | ICD-10-CM | POA: Diagnosis present

## 2017-06-11 DIAGNOSIS — M810 Age-related osteoporosis without current pathological fracture: Secondary | ICD-10-CM | POA: Diagnosis present

## 2017-06-11 DIAGNOSIS — I10 Essential (primary) hypertension: Secondary | ICD-10-CM | POA: Diagnosis present

## 2017-06-11 DIAGNOSIS — Y92007 Garden or yard of unspecified non-institutional (private) residence as the place of occurrence of the external cause: Secondary | ICD-10-CM

## 2017-06-11 DIAGNOSIS — Z79891 Long term (current) use of opiate analgesic: Secondary | ICD-10-CM

## 2017-06-11 DIAGNOSIS — F419 Anxiety disorder, unspecified: Secondary | ICD-10-CM | POA: Diagnosis present

## 2017-06-11 DIAGNOSIS — G5603 Carpal tunnel syndrome, bilateral upper limbs: Secondary | ICD-10-CM | POA: Diagnosis present

## 2017-06-11 DIAGNOSIS — R823 Hemoglobinuria: Secondary | ICD-10-CM | POA: Diagnosis present

## 2017-06-11 DIAGNOSIS — F17210 Nicotine dependence, cigarettes, uncomplicated: Secondary | ICD-10-CM | POA: Diagnosis present

## 2017-06-11 DIAGNOSIS — Z8669 Personal history of other diseases of the nervous system and sense organs: Secondary | ICD-10-CM

## 2017-06-11 DIAGNOSIS — F329 Major depressive disorder, single episode, unspecified: Secondary | ICD-10-CM | POA: Diagnosis present

## 2017-06-11 DIAGNOSIS — N401 Enlarged prostate with lower urinary tract symptoms: Secondary | ICD-10-CM | POA: Diagnosis present

## 2017-06-11 DIAGNOSIS — W458XXA Other foreign body or object entering through skin, initial encounter: Secondary | ICD-10-CM | POA: Diagnosis present

## 2017-06-11 DIAGNOSIS — L03113 Cellulitis of right upper limb: Secondary | ICD-10-CM | POA: Diagnosis not present

## 2017-06-11 LAB — COMPREHENSIVE METABOLIC PANEL
ALBUMIN: 4.2 g/dL (ref 3.5–5.0)
ALK PHOS: 59 U/L (ref 38–126)
ALT: 25 U/L (ref 17–63)
AST: 23 U/L (ref 15–41)
Anion gap: 8 (ref 5–15)
BILIRUBIN TOTAL: 0.6 mg/dL (ref 0.3–1.2)
BUN: 9 mg/dL (ref 6–20)
CALCIUM: 8.9 mg/dL (ref 8.9–10.3)
CO2: 27 mmol/L (ref 22–32)
CREATININE: 0.77 mg/dL (ref 0.61–1.24)
Chloride: 103 mmol/L (ref 101–111)
GFR calc Af Amer: >60 mL/min (ref >60)
GFR calc non Af Amer: >60 mL/min (ref >60)
GLUCOSE: 100 mg/dL — AB (ref 65–99)
POTASSIUM: 3.9 mmol/L (ref 3.5–5.1)
Sodium: 138 mmol/L (ref 135–145)
TOTAL PROTEIN: 7.5 g/dL (ref 6.5–8.1)

## 2017-06-11 LAB — URINALYSIS, ROUTINE W REFLEX MICROSCOPIC
BACTERIA UA: NONE SEEN
Bilirubin Urine: NEGATIVE
Glucose, UA: NEGATIVE mg/dL
Ketones, ur: NEGATIVE mg/dL
Leukocytes, UA: NEGATIVE
NITRITE: NEGATIVE
PROTEIN: NEGATIVE mg/dL
Specific Gravity, Urine: 1.01 (ref 1.005–1.030)
Squamous Epithelial / LPF: NONE SEEN
pH: 7 (ref 5.0–8.0)

## 2017-06-11 LAB — CBC WITH DIFFERENTIAL/PLATELET
BASOS PCT: 0 %
Basophils Absolute: 0 10*3/uL (ref 0.0–0.1)
EOS PCT: 0 %
Eosinophils Absolute: 0.1 10*3/uL (ref 0.0–0.7)
HEMATOCRIT: 36.2 % — AB (ref 39.0–52.0)
HEMOGLOBIN: 12.6 g/dL — AB (ref 13.0–17.0)
LYMPHS PCT: 13 %
Lymphs Abs: 2.2 10*3/uL (ref 0.7–4.0)
MCH: 29 pg (ref 26.0–34.0)
MCHC: 34.8 g/dL (ref 30.0–36.0)
MCV: 83.4 fL (ref 78.0–100.0)
MONO ABS: 1.2 10*3/uL — AB (ref 0.1–1.0)
Monocytes Relative: 7 %
Neutro Abs: 13.8 10*3/uL — ABNORMAL HIGH (ref 1.7–7.7)
Neutrophils Relative %: 80 %
Platelets: 205 10*3/uL (ref 150–400)
RBC: 4.34 MIL/uL (ref 4.22–5.81)
RDW: 14.2 % (ref 11.5–15.5)
WBC: 17.3 10*3/uL — ABNORMAL HIGH (ref 4.0–10.5)

## 2017-06-11 LAB — I-STAT CG4 LACTIC ACID, ED
LACTIC ACID, VENOUS: 0.64 mmol/L (ref 0.5–1.9)
Lactic Acid, Venous: 1.36 mmol/L (ref 0.5–1.9)

## 2017-06-11 MED ORDER — ACETAMINOPHEN 325 MG PO TABS
650.0000 mg | ORAL_TABLET | Freq: Once | ORAL | Status: AC | PRN
  Administered 2017-06-11: 650 mg via ORAL
  Filled 2017-06-11: qty 2

## 2017-06-11 MED ORDER — VANCOMYCIN HCL IN DEXTROSE 1-5 GM/200ML-% IV SOLN
1000.0000 mg | Freq: Once | INTRAVENOUS | Status: AC
  Administered 2017-06-11: 1000 mg via INTRAVENOUS
  Filled 2017-06-11: qty 200

## 2017-06-11 NOTE — ED Triage Notes (Signed)
Pt was seen here yesterday for right arm pain. Pt states he was unable to stay yesterday, and left. Pt reports pain increased today in right arm, and fever has maintained. Temp 102 orally in triage. Denies cheat pain and shortness of breath.

## 2017-06-11 NOTE — H&P (Addendum)
History and Physical    Phillip Mullen:[id number] DOB: 04/07/1966 DOA: 06/11/2017  PCP: [name], MD   Patient coming from: Home.  I have personally briefly reviewed patient's old medical records in [facility]  Chief Complaint: Right forearm swelling and pain.  HPI: Phillip Mullen is a 51 y.o. male with medical history significant of allergies, anxiety, bilateral carpal tunnel syndrome, chronic back pain, headache, GERD, hypertension, osteoarthritis, remote childhood seizures, osteoporosis who is returning to the emergency department after being seen yesterday afternoon for cellulitis of his right upper extremity with fever. Per patient, he was mowing the lawn at home 3 days ago and while doing so, he got close to some tree branches with torns and unfortunately stung himself accidentally with about 8 torns, which he was able to pulled out. Yesterday, he developed swelling, erythema and fever of the area. He came to the emergency department at [facility] was evaluated, partially treated in the emergency department, offered to be admitted. However, he had to sign out AMA, since he is the primary caregiver for her mother, her pets and other needs. He was given oral antibiotic prescriptions, but returns today since symptoms have persisted. He also complains of chills, fatigue, decreased appetite and malaise. He denies sore throat, dyspnea, chest pain, palpitations, dizziness, diaphoresis, lower extremity edema, PND, orthopnea, abdominal pain, nausea, emesis, diarrhea, melena or hematochezia. He complains of constipation or 4 days. He denies dysuria, frequency or hematuria. He denies polyuria, polydipsia or blurred vision.  ED Course: Initial vital signs in the emergency department temperature 38.9C, pulse 93, respirations 20, blood pressure 151/70 mmHg O2 sat 97% on room air. He received vancomycin and acetaminophen. I added Toradol 30 mg IVP and 0.45% NaCl 1000 mL bolus. His workup  shows urinalysis with moderate hemoglobinuria, but no abnormal other finding. WBC of 17.3 with 80% neutrophils, hemoglobin of 12.6 g/dL and platelets 811. His CMP showed a glucose level of 100 mg/dL, but was otherwise within normal range. a normal lactic acid 2.  Review of Systems: As per HPI otherwise 10 point review of systems negative.    Past Medical History:  Diagnosis Date  . Allergy   . Anxiety   . Bilateral carpal tunnel syndrome 02/10/2013   Per Dr Amanda Pea  . GERD (gastroesophageal reflux disease)   . Headache(784.0)   . Hypertension   . Osteoarthritis   . Osteoporosis   . Seizures (HCC)    1 seisure at age 66 per pt    Past Surgical History:  Procedure Laterality Date  . ABCESS DRAINAGE     Infected finger  . INGUINAL HERNIA REPAIR    . TONSILLECTOMY       reports that he has been smoking Cigarettes.  He has been smoking about 2.00 packs per day. He has never used smokeless tobacco. He reports that he does not drink alcohol or use drugs.  Allergies  Allergen Reactions  . Penicillins     Has patient had a PCN reaction causing immediate rash, facial/tongue/throat swelling, SOB or lightheadedness with hypotension: Unknown Has patient had a PCN reaction causing severe rash involving mucus membranes or skin necrosis: Unknown Has patient had a PCN reaction that required hospitalization: No Has patient had a PCN reaction occurring within the last 10 years: No If all of the above answers are "NO", then may proceed with Cephalosporin use.   . Sulfonamide Derivatives Hives    Pt not sure of the reaction    Family History  Problem Relation Age of Onset  . Colon cancer Neg Hx   . Esophageal cancer Neg Hx   . Rectal cancer Neg Hx   . Stomach cancer Neg Hx         His mother has a history of dementia  Prior to Admission medications   Medication Sig Start Date End Date Taking? Authorizing Provider  aspirin 81 MG tablet Take 81 mg by mouth daily.     Yes [provider]  cetirizine (ZYRTEC) 10 MG tablet TAKE 1 TABLET BY MOUTH ONCE DAILY 05/25/17  Yes [name], MD  diazepam (VALIUM) 10 MG tablet Take 15 mg by mouth 2 (two) times daily.   Yes [provider]  doxycycline (VIBRAMYCIN) 100 MG capsule Take 1 capsule (100 mg total) by mouth 2 (two) times daily. 06/10/17 06/20/17 Yes [name], [name], PA-C  magnesium hydroxide (MILK OF MAGNESIA) 800 MG/5ML suspension Take 15 mLs by mouth daily as needed for constipation.    Yes [provider]  mirtazapine (REMERON) 15 MG tablet Take 15 mg by mouth as needed (depression).  09/22/16  Yes [provider]  mometasone (NASONEX) 50 MCG/ACT nasal spray Place 2 sprays into the nose daily. Patient taking differently: Place 1 spray into the nose daily.  09/09/16  Yes [name], MD  Multiple Vitamins-Minerals (MULTIVITAMIN GUMMIES ADULT) CHEW Chew 1 Dose by mouth daily.   Yes [provider]  omeprazole (PRILOSEC) 20 MG capsule Take 1 capsule (20 mg total) by mouth daily. 09/29/16  Yes [name], [name], PA-C  oxyCODONE (OXY IR/ROXICODONE) 5 MG immediate release tablet Take 1 tablet (5 mg total) by mouth every 4 (four) hours as needed. Fill on or after 11/10/14 Patient taking differently: Take 5 mg by mouth every 4 (four) hours as needed for breakthrough pain. Fill on or after 11/10/14 10/11/14  Yes [name], MD  OxyCODONE (OXYCONTIN) 40 mg T12A 12 hr tablet Take 1 tablet (40 mg total) by mouth 3 (three) times daily. To fill on or after 11/10/2014 10/11/14  Yes [name], MD  tamsulosin (FLOMAX) 0.4 MG CAPS capsule TAKE 1 CAPSULE BY MOUTH ONCE DAILY Patient taking differently: TAKE 0.4 mg BY MOUTH ONCE DAILY 05/25/17  Yes [name], MD  ALPRAZolam Prudy Feeler) 1 MG tablet Take 1 tablet (1 mg total) by mouth 3 (three) times daily as needed. Patient not taking: Reported on 06/10/2017 08/12/16   [name], FNP    Physical Exam: Vitals:   06/11/17 1916 06/11/17 2004  06/11/17 2005  BP: (!) 151/70    Pulse: 93 85   Resp: 20    Temp:  (!) 102 F (38.9 C)   TempSrc:  Oral   SpO2: 97% 96%   Weight:   74.8 kg (165 lb)  Height:   5\' 7"  (1.702 m)    Constitutional: NAD, calm, comfortable Eyes: PERRL, lids and conjunctivae normal ENMT: Mucous membranes and lips are dry. Posterior pharynx clear of any exudate or lesions. Neck: normal, supple, no masses, no thyromegaly Respiratory: clear to auscultation bilaterally, no wheezing, no crackles. Normal respiratory effort. No accessory muscle use.  Cardiovascular: Regular rate and rhythm, no murmurs / rubs / gallops. No extremity edema. 2+ pedal pulses. No carotid bruits.  Abdomen: Soft, no tenderness, no masses palpated. No hepatosplenomegaly. Bowel sounds positive.  Musculoskeletal: no clubbing / cyanosis. Good ROM, no contractures. Normal muscle tone.  Skin: Positive edema, erythema, calor and tenderness to palpation on right  forearm. Please see pictures below. Neurologic: CN 2-12 grossly intact. Sensation intact, DTR normal. Strength 5/5 in all 4.  Psychiatric: Normal judgment and insight. Alert and oriented x 4. Normal mood.        Labs on Admission: I have personally reviewed following labs and imaging studies  CBC:  Recent Labs Lab 06/10/17 1500 06/11/17 2038  WBC 20.0* 17.3*  NEUTROABS 16.2* 13.8*  HGB 14.4 12.6*  HCT 41.9 36.2*  MCV 84.5 83.4  PLT 239 205   Basic Metabolic Panel:  Recent Labs Lab 06/10/17 1500 06/11/17 2038  NA 136 138  K 4.0 3.9  CL 101 103  CO2 26 27  GLUCOSE 116* 100*  BUN 10 9  CREATININE 0.86 0.77  CALCIUM 8.8* 8.9   GFR: Estimated Creatinine Clearance: 99.8 mL/min (by C-G formula based on SCr of 0.77 mg/dL). Liver Function Tests:  Recent Labs Lab 06/10/17 1500 06/11/17 2038  AST 20 23  ALT 26 25  ALKPHOS 63 59  BILITOT 0.7 0.6  PROT 7.4 7.5  ALBUMIN 4.4 4.2   No results for input(s): LIPASE, AMYLASE in the last 168 hours. No results for  input(s): AMMONIA in the last 168 hours. Coagulation Profile:  Recent Labs Lab 06/10/17 1500  INR 1.04   Cardiac Enzymes: No results for input(s): CKTOTAL, CKMB, CKMBINDEX, TROPONINI in the last 168 hours. BNP (last 3 results) No results for input(s): PROBNP in the last 8760 hours. HbA1C: No results for input(s): HGBA1C in the last 72 hours. CBG: No results for input(s): GLUCAP in the last 168 hours. Lipid Profile: No results for input(s): CHOL, HDL, LDLCALC, TRIG, CHOLHDL, LDLDIRECT in the last 72 hours. Thyroid Function Tests: No results for input(s): TSH, T4TOTAL, FREET4, T3FREE, THYROIDAB in the last 72 hours. Anemia Panel: No results for input(s): VITAMINB12, FOLATE, FERRITIN, TIBC, IRON, RETICCTPCT in the last 72 hours. Urine analysis:    Component Value Date/Time   COLORURINE YELLOW 06/11/2017 2115   APPEARANCEUR CLEAR 06/11/2017 2115   LABSPEC 1.010 06/11/2017 2115   PHURINE 7.0 06/11/2017 2115   GLUCOSEU NEGATIVE 06/11/2017 2115   GLUCOSEU NEGATIVE 09/09/2016 1452   HGBUR MODERATE (A) 06/11/2017 2115   BILIRUBINUR NEGATIVE 06/11/2017 2115   KETONESUR NEGATIVE 06/11/2017 2115   PROTEINUR NEGATIVE 06/11/2017 2115   UROBILINOGEN 0.2 09/09/2016 1452   NITRITE NEGATIVE 06/11/2017 2115   LEUKOCYTESUR NEGATIVE 06/11/2017 2115    Radiological Exams on Admission: No results found.  EKG: Independently reviewed   Assessment/Plan Principal Problem:   Cellulitis of right upper extremity Admit to MedSurg/inpatient. Continue IV fluids. Analgesics as needed. Toradol 30 mg IVP every 6 hours. Continue vancomycin per pharmacy. Follow-up blood cultures. Follow-up CBC and BMP daily.  Active Problems:   Anxiety/Depression Remeron 15 mg by mouth at bedtime. He is on lorazepam for muscle spasms.    Essential hypertension Not on antihypertensives. Advised to cease smoking. Monitor blood pressure.    GERD Protonix 40 mg by mouth daily.    Chronic back  pain Continues OxyContin 40 mg by mouth twice a day. Continue oxycodone IR 5 mg by mouth every 4 hours when necessary. Continue diazepam as needed for muscle spasms.    Bilateral carpal tunnel syndrome Continue analgesics as above.    Benign prostatic hyperplasia with incomplete bladder emptying Continue Flomax daily.    Hyperlipidemia Currently not on therapy. Advised about lifestyle modifications.    Tobacco use disorder Nicotine replacement therapy ordered. Tobacco cessation information to be provided.    Hemoglobinuria Likely as  a result of fever. Patient to have repeat urinalysis with PCP in a few weeks.    Constipation Secondary to opioids. Start Senokot by mouth twice a day. Magnesium hydroxide 15 mL daily as needed.    DVT prophylaxis: Heparin SQ. Code Status: Full code Family Communication:  Disposition Plan: Admit for IV antibiotics for 2-3 days. Consults called:  Admission status: Inpatient/MedSurg.   Bobette Moavid Manuel Jeaneane Adamec MD [facility] Pager [phone].  If 7PM-7AM, please contact night-coverage [email or web address] Password [email or web address]  06/11/2017, 11:51 PM

## 2017-06-11 NOTE — ED Provider Notes (Signed)
[facility]-EMERGENCY DEPT Provider Note   CSN: [id number] Arrival date & time: 06/11/17  1840     History   Chief Complaint Chief Complaint  Patient presents with  . Arm Pain    HPI Phillip Mullen is a 51 y.o. male.  HPI  Pt presenting with c/o worsening edness and pain of right upper extremity.  He was seen in the ED last night and it was recommended that he be admitted for cellulitis- however he left AMA.  Tonight he states he is back and is able to be admitted.  He has fever today.  Redness is increased outside of markings made yesterday. No vomiting, fatigue or otherwise systemic symptoms.  There are no other associated systemic symptoms, there are no other alleviating or modifying factors.   Past Medical History:  Diagnosis Date  . Allergy   . Anxiety   . Bilateral carpal tunnel syndrome 02/10/2013   Per Dr Amanda PeaGramig  . GERD (gastroesophageal reflux disease)   . Headache(784.0)   . Hypertension   . Osteoarthritis   . Osteoporosis   . Seizures (HCC)    1 seisure at age 89 per pt    Patient Active Problem List   Diagnosis Date Noted  . Cellulitis of right upper extremity 06/11/2017  . Right arm cellulitis 06/10/2017  . Sepsis (HCC) 06/10/2017  . Boil 09/14/2016  . Abdominal pain 09/09/2016  . Loss of weight 09/09/2016  . Dysphagia 09/09/2016  . Fatigue 09/09/2016  . Left knee pain 11/17/2015  . Hyperlipidemia 11/16/2015  . Rash and nonspecific skin eruption 12/25/2014  . Bladder neck obstruction 06/01/2014  . Routine health maintenance 09/09/2013  . Benign prostatic hyperplasia with incomplete bladder emptying 07/29/2013  . Bilateral carpal tunnel syndrome 02/10/2013  . Other malaise and fatigue 02/10/2013  . Shoulder pain, left 04/09/2012  . Nausea with vomiting 02/07/2010  . Chronic back pain 11/21/2008  . ALLERGIC RHINITIS, CHRONIC 02/23/2008  . Anxiety 07/19/2007  . Essential hypertension 07/19/2007  . GERD 07/19/2007  . OSTEOARTHRITIS 07/19/2007  .  Headache(784.0) 07/19/2007    Past Surgical History:  Procedure Laterality Date  . ABCESS DRAINAGE     Infected finger  . INGUINAL HERNIA REPAIR    . TONSILLECTOMY         Home Medications    Prior to Admission medications   Medication Sig Start Date End Date Taking? Authorizing Provider  aspirin 81 MG tablet Take 81 mg by mouth daily.     Yes [provider]  cetirizine (ZYRTEC) 10 MG tablet TAKE 1 TABLET BY MOUTH ONCE DAILY 05/25/17  Yes [name], [name], MD  diazepam (VALIUM) 10 MG tablet Take 15 mg by mouth 2 (two) times daily.   Yes [provider]  doxycycline (VIBRAMYCIN) 100 MG capsule Take 1 capsule (100 mg total) by mouth 2 (two) times daily. 06/10/17 06/20/17 Yes [name], [name], PA-C  magnesium hydroxide (MILK OF MAGNESIA) 800 MG/5ML suspension Take 15 mLs by mouth daily as needed for constipation.    Yes [provider]  mirtazapine (REMERON) 15 MG tablet Take 15 mg by mouth as needed (depression).  09/22/16  Yes [provider]  mometasone (NASONEX) 50 MCG/ACT nasal spray Place 2 sprays into the nose daily. Patient taking differently: Place 1 spray into the nose daily.  09/09/16  Yes [name], [name], MD  Multiple Vitamins-Minerals (MULTIVITAMIN GUMMIES ADULT) CHEW Chew 1 Dose by mouth daily.   Yes [provider]  omeprazole (PRILOSEC) 20 MG capsule  Take 1 capsule (20 mg total) by mouth daily. 09/29/16  Yes [name], [name], PA-C  oxyCODONE (OXY IR/ROXICODONE) 5 MG immediate release tablet Take 1 tablet (5 mg total) by mouth every 4 (four) hours as needed. Fill on or after 11/10/14 Patient taking differently: Take 5 mg by mouth every 4 (four) hours as needed for breakthrough pain. Fill on or after 11/10/14 10/11/14  Yes [name], MD  OxyCODONE (OXYCONTIN) 40 mg T12A 12 hr tablet Take 1 tablet (40 mg total) by mouth 3 (three) times daily. To fill on or after 11/10/2014 10/11/14  Yes [name], MD  tamsulosin (FLOMAX) 0.4 MG CAPS  capsule TAKE 1 CAPSULE BY MOUTH ONCE DAILY Patient taking differently: TAKE 0.4 mg BY MOUTH ONCE DAILY 05/25/17  Yes [name], MD  ALPRAZolam Prudy Feeler) 1 MG tablet Take 1 tablet (1 mg total) by mouth 3 (three) times daily as needed. Patient not taking: Reported on 06/10/2017 08/12/16   [name], FNP    Family History Family History  Problem Relation Age of Onset  . Dementia Mother   . Deep vein thrombosis Mother   . Colon cancer Neg Hx   . Esophageal cancer Neg Hx   . Rectal cancer Neg Hx   . Stomach cancer Neg Hx     Social History Social History  Substance Use Topics  . Smoking status: Current Every Day Smoker    Packs/day: 2.00    Types: Cigarettes  . Smokeless tobacco: Never Used     Comment: form given 09-29-16  . Alcohol use No     Allergies   Penicillins and Sulfonamide derivatives   Review of Systems Review of Systems  ROS reviewed and all otherwise negative except for mentioned in HPI   Physical Exam Updated Vital Signs BP (!) 151/70 (BP Location: Left Arm)   Pulse 85   Temp (!) 102 F (38.9 C) (Oral)   Resp 20   Ht 5\' 7"  (1.702 m)   Wt 74.8 kg (165 lb)   SpO2 96%   BMI 25.84 kg/m  Vitals reviewed Physical Exam  Physical Examination: General appearance - alert, well appearing, and in no distress Mental status - alert, oriented to person, place, and time Eyes - no conjunctival injection, no scleral icterus Mouth - mucous membranes moist, pharynx normal without lesions Chest - clear to auscultation, no wheezes, rales or rhonchi, symmetric air entry Heart - normal rate, regular rhythm, normal S1, S2, no murmurs, rubs, clicks or gallops Neurological - alert, oriented, normal speech Musculoskeletal - no joint tenderness, deformity or swelling Extremities - peripheral pulses normal, no pedal edema, no clubbing or cyanosis Skin - normal coloration and turgor erythema over dorsum of forearm now extending outside of marked lines to elbow and to  dorsum of hand.  No area of fluctuance or drainage   ED Treatments / Results  Labs (all labs ordered are listed, but only abnormal results are displayed) Labs Reviewed  COMPREHENSIVE METABOLIC PANEL - Abnormal; Notable for the following:       Result Value   Glucose, Bld 100 (*)    All other components within normal limits  CBC WITH DIFFERENTIAL/PLATELET - Abnormal; Notable for the following:    WBC 17.3 (*)    Hemoglobin 12.6 (*)    HCT 36.2 (*)    Neutro Abs 13.8 (*)    Monocytes Absolute 1.2 (*)    All other components within normal limits  URINALYSIS, ROUTINE W REFLEX MICROSCOPIC -  Abnormal; Notable for the following:    Hgb urine dipstick MODERATE (*)    All other components within normal limits  I-STAT CG4 LACTIC ACID, ED  I-STAT CG4 LACTIC ACID, ED    EKG  EKG Interpretation None       Radiology No results found.  Procedures Procedures (including critical care time)  Medications Ordered in ED Medications  acetaminophen (TYLENOL) tablet 650 mg (650 mg Oral Given 06/11/17 2024)  vancomycin (VANCOCIN) IVPB 1000 mg/200 mL premix (1,000 mg Intravenous New Bag/Given 06/11/17 2330)     Initial Impression / Assessment and Plan / ED Course  I have reviewed the triage vital signs and the nursing notes.  Pertinent labs & imaging results that were available during my care of the patient were reviewed by me and considered in my medical decision making (see chart for details).    11:37 PM  D/w Dr. Robb Matar, triad for admission,  He will write bed request.    Pt presenting with worsening cellulitis of right upper extremity advised to be admitted last night but left AMA.  Tonight he states he is able to be admitted.  Cellulitis continues to spread.  Per chart review blood cultures are negative from yesterday. Pt given IV vanc in the ED.    Final Clinical Impressions(s) / ED Diagnoses   Final diagnoses:  Cellulitis of right upper extremity  Leukocytosis, unspecified type     New Prescriptions New Prescriptions   No medications on file     Phillis Haggis, MD 06/12/17 [phone]

## 2017-06-12 ENCOUNTER — Encounter (HOSPITAL_COMMUNITY): Payer: Self-pay | Admitting: Internal Medicine

## 2017-06-12 DIAGNOSIS — G8929 Other chronic pain: Secondary | ICD-10-CM

## 2017-06-12 DIAGNOSIS — K5900 Constipation, unspecified: Secondary | ICD-10-CM | POA: Diagnosis present

## 2017-06-12 DIAGNOSIS — M549 Dorsalgia, unspecified: Secondary | ICD-10-CM

## 2017-06-12 DIAGNOSIS — F17200 Nicotine dependence, unspecified, uncomplicated: Secondary | ICD-10-CM | POA: Diagnosis present

## 2017-06-12 DIAGNOSIS — K5903 Drug induced constipation: Secondary | ICD-10-CM

## 2017-06-12 LAB — CBC WITH DIFFERENTIAL/PLATELET
BASOS ABS: 0 10*3/uL (ref 0.0–0.1)
BASOS PCT: 0 %
EOS PCT: 1 %
Eosinophils Absolute: 0.2 10*3/uL (ref 0.0–0.7)
HCT: 32.4 % — ABNORMAL LOW (ref 39.0–52.0)
Hemoglobin: 11.2 g/dL — ABNORMAL LOW (ref 13.0–17.0)
Lymphocytes Relative: 16 %
Lymphs Abs: 2.2 10*3/uL (ref 0.7–4.0)
MCH: 29.2 pg (ref 26.0–34.0)
MCHC: 34.6 g/dL (ref 30.0–36.0)
MCV: 84.4 fL (ref 78.0–100.0)
MONO ABS: 1.4 10*3/uL — AB (ref 0.1–1.0)
Monocytes Relative: 10 %
Neutro Abs: 10 10*3/uL — ABNORMAL HIGH (ref 1.7–7.7)
Neutrophils Relative %: 73 %
PLATELETS: 176 10*3/uL (ref 150–400)
RBC: 3.84 MIL/uL — AB (ref 4.22–5.81)
RDW: 14.4 % (ref 11.5–15.5)
WBC: 13.9 10*3/uL — AB (ref 4.0–10.5)

## 2017-06-12 LAB — BASIC METABOLIC PANEL
ANION GAP: 5 (ref 5–15)
BUN: 8 mg/dL (ref 6–20)
CALCIUM: 8.4 mg/dL — AB (ref 8.9–10.3)
CO2: 26 mmol/L (ref 22–32)
Chloride: 106 mmol/L (ref 101–111)
Creatinine, Ser: 0.69 mg/dL (ref 0.61–1.24)
Glucose, Bld: 93 mg/dL (ref 65–99)
POTASSIUM: 3.7 mmol/L (ref 3.5–5.1)
SODIUM: 137 mmol/L (ref 135–145)

## 2017-06-12 LAB — HIV ANTIBODY (ROUTINE TESTING W REFLEX): HIV SCREEN 4TH GENERATION: NONREACTIVE

## 2017-06-12 MED ORDER — ENOXAPARIN SODIUM 40 MG/0.4ML ~~LOC~~ SOLN
40.0000 mg | Freq: Every day | SUBCUTANEOUS | Status: DC
  Administered 2017-06-12 (×2): 40 mg via SUBCUTANEOUS
  Filled 2017-06-12 (×2): qty 0.4

## 2017-06-12 MED ORDER — DIAZEPAM 5 MG PO TABS
10.0000 mg | ORAL_TABLET | Freq: Three times a day (TID) | ORAL | Status: DC | PRN
  Administered 2017-06-12 (×2): 10 mg via ORAL
  Filled 2017-06-12 (×2): qty 2

## 2017-06-12 MED ORDER — ENSURE ENLIVE PO LIQD
237.0000 mL | Freq: Two times a day (BID) | ORAL | Status: DC
  Administered 2017-06-12 – 2017-06-13 (×2): 237 mL via ORAL

## 2017-06-12 MED ORDER — OXYCODONE HCL 5 MG PO TABS: 5.0000 mg | ORAL_TABLET | ORAL | Status: DC | PRN

## 2017-06-12 MED ORDER — SODIUM CHLORIDE 0.45 % IV SOLN
INTRAVENOUS | Status: DC
  Administered 2017-06-12 (×2): via INTRAVENOUS

## 2017-06-12 MED ORDER — ONDANSETRON HCL 4 MG/2ML IJ SOLN: 4.0000 mg | Freq: Four times a day (QID) | INTRAMUSCULAR | Status: DC | PRN

## 2017-06-12 MED ORDER — SODIUM CHLORIDE 0.45 % IV BOLUS
1000.0000 mL | Freq: Once | INTRAVENOUS | Status: AC
  Administered 2017-06-12: 1000 mL via INTRAVENOUS

## 2017-06-12 MED ORDER — KETOROLAC TROMETHAMINE 30 MG/ML IJ SOLN
30.0000 mg | Freq: Once | INTRAMUSCULAR | Status: AC
  Administered 2017-06-12: 30 mg via INTRAVENOUS
  Filled 2017-06-12: qty 1

## 2017-06-12 MED ORDER — MIRTAZAPINE 15 MG PO TABS
15.0000 mg | ORAL_TABLET | Freq: Every day | ORAL | Status: DC
Start: 2017-06-12 — End: 2017-06-13
  Administered 2017-06-12 (×2): 15 mg via ORAL
  Filled 2017-06-12 (×2): qty 1

## 2017-06-12 MED ORDER — NICOTINE 21 MG/24HR TD PT24: 21.0000 mg | MEDICATED_PATCH | Freq: Every day | TRANSDERMAL | Status: DC | PRN

## 2017-06-12 MED ORDER — LORATADINE 10 MG PO TABS
10.0000 mg | ORAL_TABLET | Freq: Every day | ORAL | Status: DC
  Administered 2017-06-12 – 2017-06-13 (×2): 10 mg via ORAL
  Filled 2017-06-12 (×2): qty 1

## 2017-06-12 MED ORDER — VANCOMYCIN HCL IN DEXTROSE 1-5 GM/200ML-% IV SOLN
1000.0000 mg | Freq: Two times a day (BID) | INTRAVENOUS | Status: DC
  Administered 2017-06-12 – 2017-06-13 (×3): 1000 mg via INTRAVENOUS
  Filled 2017-06-12 (×3): qty 200

## 2017-06-12 MED ORDER — SENNOSIDES-DOCUSATE SODIUM 8.6-50 MG PO TABS
1.0000 | ORAL_TABLET | Freq: Two times a day (BID) | ORAL | Status: DC
  Administered 2017-06-12 (×3): 1 via ORAL
  Filled 2017-06-12 (×3): qty 1

## 2017-06-12 MED ORDER — PANTOPRAZOLE SODIUM 40 MG PO TBEC
40.0000 mg | DELAYED_RELEASE_TABLET | Freq: Every day | ORAL | Status: DC
  Administered 2017-06-12 – 2017-06-13 (×2): 40 mg via ORAL
  Filled 2017-06-12 (×2): qty 1

## 2017-06-12 MED ORDER — NICOTINE 21 MG/24HR TD PT24
21.0000 mg | MEDICATED_PATCH | Freq: Every day | TRANSDERMAL | Status: DC
  Administered 2017-06-12: 21 mg via TRANSDERMAL
  Filled 2017-06-12: qty 1

## 2017-06-12 MED ORDER — OXYCODONE HCL ER 40 MG PO T12A
40.0000 mg | EXTENDED_RELEASE_TABLET | Freq: Three times a day (TID) | ORAL | Status: DC
  Administered 2017-06-12 – 2017-06-13 (×5): 40 mg via ORAL
  Filled 2017-06-12 (×5): qty 1

## 2017-06-12 MED ORDER — ONDANSETRON HCL 4 MG PO TABS: 4.0000 mg | ORAL_TABLET | Freq: Four times a day (QID) | ORAL | Status: DC | PRN

## 2017-06-12 MED ORDER — ASPIRIN EC 81 MG PO TBEC
81.0000 mg | DELAYED_RELEASE_TABLET | Freq: Every day | ORAL | Status: DC
  Administered 2017-06-12 – 2017-06-13 (×2): 81 mg via ORAL
  Filled 2017-06-12 (×2): qty 1

## 2017-06-12 MED ORDER — POLYETHYLENE GLYCOL 3350 17 G PO PACK
17.0000 g | PACK | Freq: Every day | ORAL | Status: DC
  Filled 2017-06-12: qty 1

## 2017-06-12 MED ORDER — KETOROLAC TROMETHAMINE 30 MG/ML IJ SOLN
30.0000 mg | Freq: Three times a day (TID) | INTRAMUSCULAR | Status: DC
Start: 2017-06-12 — End: 2017-06-13
  Administered 2017-06-12 – 2017-06-13 (×6): 30 mg via INTRAVENOUS
  Filled 2017-06-12 (×6): qty 1

## 2017-06-12 MED ORDER — GUAIFENESIN-DM 100-10 MG/5ML PO SYRP
5.0000 mL | ORAL_SOLUTION | ORAL | Status: DC | PRN
  Administered 2017-06-13: 5 mL via ORAL
  Filled 2017-06-12: qty 10

## 2017-06-12 MED ORDER — MAGNESIUM HYDROXIDE 400 MG/5ML PO SUSP: 30.0000 mL | Freq: Every day | ORAL | Status: DC | PRN

## 2017-06-12 MED ORDER — TAMSULOSIN HCL 0.4 MG PO CAPS
0.4000 mg | ORAL_CAPSULE | Freq: Every day | ORAL | Status: DC
  Administered 2017-06-12 – 2017-06-13 (×2): 0.4 mg via ORAL
  Filled 2017-06-12 (×2): qty 1

## 2017-06-12 NOTE — Progress Notes (Signed)
  PROGRESS NOTE  Phillip Mullen KXF:[id number] DOB: Jan 15, 1966 DOA: 06/11/2017 PCP: [name], MD  Brief Narrative: 51 year old male presented with pain and redness of the right upper extremity that developed after he was mowing the grass and his arm was injured by a thorn bush, he pulled out 8 thorns from arm.  Assessment/Plan Cellulitis right upper extremity -Improving. Continue IV vancomycin.  Chronic back pain -Continue OxyContin, oxycodone, diazepam  Tobacco use disorder. -Nicotine patch -Recommend cessation  Constipation -Bowel regimen for narcotic induced constipation  DVT prophylaxis: enoxaparin Code Status: full Family Communication: none Disposition Plan: home    Brendia Sacks, MD  [facility] Direct contact: [phone] --Via amion app OR  --[email or web address]; password [email or web address]  7PM-7AM contact night coverage as above 06/12/2017, 4:41 PM  LOS: 1 day   Consultants:    Procedures:    Antimicrobials:  Vancomycin 8/16 >>  Interval history/Subjective: Feels a lot better today. Arm feels much better today. No right hand pain, no difficulty using the hand, no numbness or tingling.  Objective: Vitals: MAXIMUM TEMPERATURE 102.0 , 97.6, 16, 52, 107/63, 98% on room air   Constitutional: Appears calm, comfortable.  Cardiovascular. Regular rate and rhythm. No murmur, rub or gallop. No lower extremity edema.  Respiratory. Clear to auscultation bilaterally. No wheezes, rales or rhonchi. Normal respiratory effort.  Skin. Erythema of the right arm appears to be improving. There is decreased edema in the hand. The hand appears uninvolved. Sensation grossly intact, perfusion grossly intact on hand.  Psychiatric. Grossly normal mood and affect. Speech fluent and appropriate.  I have personally reviewed the following:   Labs:  Basic metabolic panel unremarkable  WBC trending down, 13.9  Scheduled Meds: . aspirin EC  81 mg Oral Daily  . enoxaparin  (LOVENOX) injection  40 mg Subcutaneous QHS  . feeding supplement (ENSURE ENLIVE)  237 mL Oral BID BM  . ketorolac  30 mg Intravenous Q8H  . loratadine  10 mg Oral Daily  . mirtazapine  15 mg Oral QHS  . nicotine  21 mg Transdermal Daily  . oxyCODONE  40 mg Oral Q8H  . pantoprazole  40 mg Oral Daily  . polyethylene glycol  17 g Oral Daily  . senna-docusate  1 tablet Oral BID  . tamsulosin  0.4 mg Oral Daily   Continuous Infusions: . vancomycin Stopped (06/12/17 0951)    Principal Problem:   Cellulitis of right upper extremity Active Problems:   Essential hypertension   Chronic back pain   Tobacco use disorder   Constipation   LOS: 1 day

## 2017-06-12 NOTE — Progress Notes (Signed)
Nutrition Brief Note  Patient identified on the Malnutrition Screening Tool (MST) Report  Pt sleeping at time of visit. No muscle or fat depletion noted. Lunch tray at bedside and completely eaten 100%. Pt has been ordered Ensure supplements.  Wt Readings from Last 15 Encounters:  06/11/17 165 lb (74.8 kg)  06/10/17 165 lb (74.8 kg)  11/27/16 158 lb (71.7 kg)  09/29/16 158 lb 6.4 oz (71.8 kg)  09/09/16 157 lb (71.2 kg)  11/16/15 171 lb (77.6 kg)  12/25/14 173 lb (78.5 kg)  08/03/14 177 lb (80.3 kg)  07/20/14 177 lb 6.4 oz (80.5 kg)  06/01/14 173 lb 8 oz (78.7 kg)  01/19/14 175 lb 12 oz (79.7 kg)  12/08/13 175 lb (79.4 kg)  09/07/13 171 lb (77.6 kg)  07/29/13 182 lb (82.6 kg)  02/10/13 176 lb 2 oz (79.9 kg)    Body mass index is 25.84 kg/m. Patient meets criteria for overweight based on current BMI.   Current diet order is regular, patient is consuming approximately 100% of meals at this time. Labs and medications reviewed.   No nutrition interventions warranted at this time. If nutrition issues arise, please consult RD.   Phillip Franco, MS, RD, LDN Pager: [phone] After Hours Pager: [phone]

## 2017-06-12 NOTE — Progress Notes (Signed)
Pharmacy Antibiotic Note  Phillip Mullen is a 51 y.o. male admitted on 06/11/2017 with cellulitis.  Pharmacy has been consulted for Vancomycin dosing.  Plan: Vancomycin 1gm IV every 12 hours.  Goal trough 10-15 mcg/mL.  Height: 5\' 7"  (170.2 cm) Weight: 165 lb (74.8 kg) IBW/kg (Calculated) : 66.1  Temp (24hrs), Avg:99.8 F (37.7 C), Min:98.6 F (37 C), Max:102 F (38.9 C)   Recent Labs Lab 06/10/17 1500 06/10/17 1514 06/10/17 1819 06/11/17 2038 06/11/17 2057 06/11/17 2338 06/12/17 0329  WBC 20.0*  --   --  17.3*  --   --  13.9*  CREATININE 0.86  --   --  0.77  --   --  0.69  LATICACIDVEN  --  0.73 2.15*  --  1.36 0.64  --     Estimated Creatinine Clearance: 99.8 mL/min (by C-G formula based on SCr of 0.69 mg/dL).    Allergies  Allergen Reactions  . Penicillins     Has patient had a PCN reaction causing immediate rash, facial/tongue/throat swelling, SOB or lightheadedness with hypotension: Unknown Has patient had a PCN reaction causing severe rash involving mucus membranes or skin necrosis: Unknown Has patient had a PCN reaction that required hospitalization: No Has patient had a PCN reaction occurring within the last 10 years: No If all of the above answers are "NO", then may proceed with Cephalosporin use.   . Sulfonamide Derivatives Hives    Pt not sure of the reaction    Antimicrobials this admission: Vancomycin 06/11/2017 >>   Dose adjustments this admission: -  Microbiology results: pending  Thank you for allowing pharmacy to be a part of this patient's care.  Phillip Mullen 06/12/2017 5:51 AM

## 2017-06-12 NOTE — Care Management Note (Signed)
Case Management Note  Patient Details  Name: Phillip Mullen MRN: [id number] Date of Birth: 22-Jul-1966  Subjective/Objective:     51 yo admitted with Cellulitis of right upper extremity. Medical history significant of allergies, anxiety, bilateral carpal tunnel syndrome, chronic back pain, headache, GERD, hypertension, osteoarthritis, remote childhood seizures, osteoporosis                Action/Plan: Home.  Expected Discharge Date:                  Expected Discharge Plan:  Home/Self Care  In-House Referral:     Discharge planning Services  CM Consult  Post Acute Care Choice:    Choice offered to:     DME Arranged:    DME Agency:     HH Arranged:    HH Agency:     Status of Service:  In process, will continue to follow  If discussed at Long Length of Stay Meetings, dates discussed:    Additional CommentsBartholome Bill, RN 06/12/2017, 10:40 AM  [phone]

## 2017-06-13 DIAGNOSIS — L03113 Cellulitis of right upper limb: Principal | ICD-10-CM

## 2017-06-13 LAB — BASIC METABOLIC PANEL
ANION GAP: 3 — AB (ref 5–15)
BUN: 12 mg/dL (ref 6–20)
CALCIUM: 8.3 mg/dL — AB (ref 8.9–10.3)
CO2: 30 mmol/L (ref 22–32)
Chloride: 107 mmol/L (ref 101–111)
Creatinine, Ser: 0.84 mg/dL (ref 0.61–1.24)
GFR calc Af Amer: >60 mL/min (ref >60)
Glucose, Bld: 107 mg/dL — ABNORMAL HIGH (ref 65–99)
POTASSIUM: 4.3 mmol/L (ref 3.5–5.1)
SODIUM: 140 mmol/L (ref 135–145)

## 2017-06-13 MED ORDER — CLINDAMYCIN HCL 300 MG PO CAPS: 300.0000 mg | ORAL_CAPSULE | Freq: Three times a day (TID) | ORAL | 0 refills | Status: DC

## 2017-06-13 NOTE — Discharge Instructions (Addendum)

## 2017-06-13 NOTE — Progress Notes (Signed)
Notified charge  nurse that CM needed to see patient prior to discharge.  Information did get to patient's nurse but patient left minutes before CM on unit to give patient CODE 44 form, patient was not driving and had already gone some distance when he was reached via cell by the floor nurse so wasn't willing to return to hospital.   L. Shela Commons Vittoria Noreen RN, BSN, CM

## 2017-06-13 NOTE — Discharge Summary (Signed)
Physician Discharge Summary  Phillip Mullen [id number] DOB: 02/06/1966 DOA: 06/11/2017  PCP: [name], MD  Admit date: 06/11/2017 Discharge date: 06/13/2017  Recommendations for Outpatient Follow-up:  Continue clindamycin for 7 days on discharge.  Discharge Diagnoses:  Principal Problem:   Cellulitis of right upper extremity Active Problems:   Essential hypertension   Chronic back pain   Tobacco use disorder   Constipation    Discharge Condition: stable   Diet recommendation: as tolerated   History of present illness:  51 year old male with chronic pain, constipation who presented with RUE redness and pain. He was started on vanco for cellulitis.   Hospital Course:   Principal Problem:   Cellulitis of right upper extremity - Improved since admission but pt still has redness - he was on vanco in hospital - He was on doxycycline outpt and failed that treatment - He wants to go home today - He will be discharged with clinda for 7 days  Active Problems:   Chronic back pain - Continue home meds     Tobacco use disorder - Counseled on cessation    Constipation - Continue home meds   Signed:  Manson Passey, MD  [facility] 06/13/2017, 9:34 AM  Pager #: [phone]  Time spent in minutes: more than 30 minutes  Procedures:  None  Consultations:  None  Discharge Exam: Vitals:   06/12/17 2048 06/13/17 0637  BP: (!) 161/92 131/87  Pulse: 82 64  Resp: 16 16  Temp: 98 F (36.7 C) 97.9 F (36.6 C)  SpO2: 97% 98%   Vitals:   06/12/17 0534 06/12/17 1430 06/12/17 2048 06/13/17 0637  BP: 107/63 128/89 (!) 161/92 131/87  Pulse: (!) 52 66 82 64  Resp: 16 18 16 16   Temp: 97.6 F (36.4 C) 98.5 F (36.9 C) 98 F (36.7 C) 97.9 F (36.6 C)  TempSrc: Oral Oral Oral Oral  SpO2: 98% 99% 97% 98%  Weight:      Height:        General: Pt is alert, follows commands appropriately, not in acute distress Cardiovascular: Regular rate and  rhythm, S1/S2 +, no murmurs Respiratory: Clear to auscultation bilaterally, no wheezing, no crackles, no rhonchi Abdominal: Soft, non tender, non distended, bowel sounds +, no guarding Extremities:right arm redness imroved Neuro: Grossly nonfocal  Discharge Instructions  Discharge Instructions    Call MD for:  persistant nausea and vomiting    Complete by:  As directed    Call MD for:  severe uncontrolled pain    Complete by:  As directed    Diet - low sodium heart healthy    Complete by:  As directed    Discharge instructions    Complete by:  As directed    Continue clindamycin for 7 days on discharge.   Increase activity slowly    Complete by:  As directed      Allergies as of 06/13/2017      Reactions   Penicillins    Has patient had a PCN reaction causing immediate rash, facial/tongue/throat swelling, SOB or lightheadedness with hypotension: Unknown Has patient had a PCN reaction causing severe rash involving mucus membranes or skin necrosis: Unknown Has patient had a PCN reaction that required hospitalization: No Has patient had a PCN reaction occurring within the last 10 years: No If all of the above answers are "NO", then may proceed with Cephalosporin use.   Sulfonamide Derivatives Hives   Pt not sure of the reaction  Medication List    STOP taking these medications   ALPRAZolam 1 MG tablet Commonly known as:  XANAX   doxycycline 100 MG capsule Commonly known as:  VIBRAMYCIN     TAKE these medications   aspirin 81 MG tablet Take 81 mg by mouth daily.   cetirizine 10 MG tablet Commonly known as:  ZYRTEC TAKE 1 TABLET BY MOUTH ONCE DAILY   clindamycin 300 MG capsule Commonly known as:  CLEOCIN Take 1 capsule (300 mg total) by mouth 3 (three) times daily.   diazepam 10 MG tablet Commonly known as:  VALIUM Take 15 mg by mouth 2 (two) times daily.   magnesium hydroxide 800 MG/5ML suspension Commonly known as:  MILK OF MAGNESIA Take 15 mLs by mouth  daily as needed for constipation.   mirtazapine 15 MG tablet Commonly known as:  REMERON Take 15 mg by mouth as needed (depression).   mometasone 50 MCG/ACT nasal spray Commonly known as:  NASONEX Place 2 sprays into the nose daily. What changed:  how much to take   MULTIVITAMIN GUMMIES ADULT Chew Chew 1 Dose by mouth daily.   omeprazole 20 MG capsule Commonly known as:  PRILOSEC Take 1 capsule (20 mg total) by mouth daily.   oxyCODONE 40 mg 12 hr tablet Commonly known as:  OXYCONTIN Take 1 tablet (40 mg total) by mouth 3 (three) times daily. To fill on or after 11/10/2014 What changed:  Another medication with the same name was changed. Make sure you understand how and when to take each.   oxyCODONE 5 MG immediate release tablet Commonly known as:  Oxy IR/ROXICODONE Take 1 tablet (5 mg total) by mouth every 4 (four) hours as needed. Fill on or after 11/10/14 What changed:  reasons to take this  additional instructions   tamsulosin 0.4 MG Caps capsule Commonly known as:  FLOMAX TAKE 1 CAPSULE BY MOUTH ONCE DAILY What changed:  See the new instructions.      Follow-up Information    [name], MD. Schedule an appointment as soon as possible for a visit.   Specialties:  Internal Medicine, Radiology Contact information: [address] [place] Schwal[place] [facility] [place] [address] [phone]            The results of significant diagnostics from this hospitalization (including imaging, microbiology, ancillary and laboratory) are listed below for reference.    Significant Diagnostic Studies: No results found.  Microbiology: Recent Results (from the past 240 hour(s))  Culture, blood (Routine x 2)     Status: None (Preliminary result)   Collection Time: 06/10/17  3:00 PM  Result Value Ref Range Status   Specimen Description BLOOD LEFT ANTECUBITAL  Final   Special Requests   Final    BOTTLES DRAWN AEROBIC AND ANAEROBIC Blood Culture adequate volume   Culture   Final     NO GROWTH 3 DAYS Performed at [facility], [address] [place], [place] [address]    Report Status PENDING  Incomplete  Culture, blood (Routine x 2)     Status: None (Preliminary result)   Collection Time: 06/10/17  3:45 PM  Result Value Ref Range Status   Specimen Description BLOOD LEFT FOREARM  Final   Special Requests   Final    BOTTLES DRAWN AEROBIC AND ANAEROBIC Blood Culture adequate volume   Culture   Final    NO GROWTH 2 DAYS Performed at [facility], [address] [place], [place] [address]  Report Status PENDING  Incomplete     Labs: Basic Metabolic Panel:  Recent Labs Lab 06/10/17 1500 06/11/17 2038 06/12/17 0329 06/13/17 0327  NA 136 138 137 140  K 4.0 3.9 3.7 4.3  CL 101 103 106 107  CO2 26 27 26 30   GLUCOSE 116* 100* 93 107*  BUN 10 9 8 12   CREATININE 0.86 0.77 0.69 0.84  CALCIUM 8.8* 8.9 8.4* 8.3*   Liver Function Tests:  Recent Labs Lab 06/10/17 1500 06/11/17 2038  AST 20 23  ALT 26 25  ALKPHOS 63 59  BILITOT 0.7 0.6  PROT 7.4 7.5  ALBUMIN 4.4 4.2   No results for input(s): LIPASE, AMYLASE in the last 168 hours. No results for input(s): AMMONIA in the last 168 hours. CBC:  Recent Labs Lab 06/10/17 1500 06/11/17 2038 06/12/17 0329  WBC 20.0* 17.3* 13.9*  NEUTROABS 16.2* 13.8* 10.0*  HGB 14.4 12.6* 11.2*  HCT 41.9 36.2* 32.4*  MCV 84.5 83.4 84.4  PLT 239 205 176   Cardiac Enzymes: No results for input(s): CKTOTAL, CKMB, CKMBINDEX, TROPONINI in the last 168 hours. BNP: BNP (last 3 results) No results for input(s): BNP in the last 8760 hours.  ProBNP (last 3 results) No results for input(s): PROBNP in the last 8760 hours.  CBG: No results for input(s): GLUCAP in the last 168 hours.

## 2017-06-13 NOTE — Progress Notes (Signed)
Pt discharged home in stable condition. Discharge instructions and script given to patient. Script also sent to pharmacy of choice. Pt verbalized understanding. No immediate questions or concerns.

## 2017-06-15 LAB — CULTURE, BLOOD (ROUTINE X 2)
Culture: NO GROWTH
Culture: NO GROWTH
SPECIAL REQUESTS: ADEQUATE
SPECIAL REQUESTS: ADEQUATE

## 2017-08-18 DIAGNOSIS — F411 Generalized anxiety disorder: Secondary | ICD-10-CM | POA: Diagnosis not present

## 2017-08-27 DIAGNOSIS — M19012 Primary osteoarthritis, left shoulder: Secondary | ICD-10-CM | POA: Diagnosis not present

## 2017-08-27 DIAGNOSIS — M7542 Impingement syndrome of left shoulder: Secondary | ICD-10-CM | POA: Diagnosis not present

## 2017-09-12 ENCOUNTER — Ambulatory Visit (INDEPENDENT_AMBULATORY_CARE_PROVIDER_SITE_OTHER): Payer: Medicare Other | Admitting: Family Medicine

## 2017-09-12 ENCOUNTER — Encounter: Payer: Self-pay | Admitting: Family Medicine

## 2017-09-12 VITALS — BP 126/74 | HR 60 | Temp 98.2°F | Ht 67.0 in | Wt 154.2 lb

## 2017-09-12 DIAGNOSIS — N410 Acute prostatitis: Secondary | ICD-10-CM

## 2017-09-12 DIAGNOSIS — R300 Dysuria: Secondary | ICD-10-CM

## 2017-09-12 LAB — POCT URINALYSIS DIPSTICK
Bilirubin, UA: NEGATIVE
GLUCOSE UA: NEGATIVE
Ketones, UA: NEGATIVE
Leukocytes, UA: NEGATIVE
NITRITE UA: NEGATIVE
PH UA: 6 (ref 5.0–8.0)
PROTEIN UA: NEGATIVE
RBC UA: NEGATIVE
Spec Grav, UA: 1.015 (ref 1.010–1.025)
UROBILINOGEN UA: 0.2 U/dL

## 2017-09-12 MED ORDER — MOMETASONE FUROATE 50 MCG/ACT NA SUSP: 2.0000 | Freq: Every day | NASAL | 0 refills | Status: AC

## 2017-09-12 MED ORDER — CIPROFLOXACIN HCL 500 MG PO TABS: 500.0000 mg | ORAL_TABLET | Freq: Two times a day (BID) | ORAL | 0 refills | Status: DC

## 2017-09-12 NOTE — Progress Notes (Signed)
   Subjective:    Patient ID: Phillip Mullen, male    DOB: 03-11-1966, 51 y.o.   MRN: [id number]  HPI Here for 3 days of urinary urgency and frequency with lower abdominal pressure. No burning or fever. He has had prostate infections in the past and he says this feels like one. His BMs are regular.    Review of Systems  Constitutional: Negative.   Respiratory: Negative.   Cardiovascular: Negative.   Gastrointestinal: Positive for abdominal pain. Negative for abdominal distention, anal bleeding, blood in stool, constipation, diarrhea, nausea, rectal pain and vomiting.  Genitourinary: Positive for difficulty urinating, frequency and urgency. Negative for discharge, dysuria, flank pain and hematuria.       Objective:   Physical Exam  Constitutional: He appears well-developed and well-nourished.  Cardiovascular: Normal rate, regular rhythm, normal heart sounds and intact distal pulses.  Pulmonary/Chest: Effort normal and breath sounds normal. No respiratory distress. He has no wheezes.  Abdominal: Soft. Bowel sounds are normal. He exhibits no distension and no mass. There is no tenderness. There is no rebound and no guarding.          Assessment & Plan:  Prostatitis, treat with Cipro. Drink plenty of water.  Gershon CraneStephen Donzell Coller, MD

## 2017-09-22 ENCOUNTER — Other Ambulatory Visit: Payer: Self-pay | Admitting: Internal Medicine

## 2017-10-05 ENCOUNTER — Ambulatory Visit: Payer: Medicare Other

## 2017-10-12 NOTE — Progress Notes (Signed)
Subjective:   Phillip Mullen is a 51 y.o. male who presents for an Initial Medicare Annual Wellness Visit.  Review of Systems  No ROS.  Medicare Wellness Visit. Additional risk factors are reflected in the social history.  Cardiac Risk Factors include: dyslipidemia;male gender;hypertension Sleep patterns: has interrupted sleep, gets up 2-3 times nightly to void and sleeps 3-4 hours nightly.  Patient reports insomnia issues, discussed recommended sleep tips and stress reduction tips.   Home Safety/Smoke Alarms: Feels safe in home. Smoke alarms in place.  Living environment; residence and Firearm Safety: 1-story house/ trailer, no firearms. Lives alone, no needs for DME, limited support system Seat Belt Safety/Bike Helmet: Wears seat belt.    Objective:    Today's Vitals   10/13/17 1325 10/13/17 1330  BP: (!) 144/82   Pulse: 88   Resp: 20   SpO2: 96%   Weight: 154 lb (69.9 kg)   Height: 5\' 7"  (1.702 m)   PainSc:  4    Body mass index is 24.12 kg/m.  Advanced Directives 10/13/2017 06/12/2017 06/11/2017 06/10/2017 06/10/2017 07/20/2014  Does Patient Have a Medical Advance Directive? No No No No No No  Would patient like information on creating a medical advance directive? No - Patient declined No - Patient declined - No - Patient declined - No - patient declined information    Current Medications (verified) Outpatient Encounter Medications as of 10/13/2017  Medication Sig  . aspirin 81 MG tablet Take 81 mg by mouth daily.    . cetirizine (ZYRTEC) 10 MG tablet TAKE 1 TABLET BY MOUTH ONCE A DAY  . diazepam (VALIUM) 10 MG tablet Take 15 mg by mouth 2 (two) times daily.  . magnesium hydroxide (MILK OF MAGNESIA) 800 MG/5ML suspension Take 15 mLs by mouth daily as needed for constipation.   . mometasone (NASONEX) 50 MCG/ACT nasal spray Place 2 sprays daily into the nose.  . Multiple Vitamins-Minerals (MULTIVITAMIN GUMMIES ADULT) CHEW Chew 1 Dose by mouth daily.  Marland Kitchen. omeprazole  (PRILOSEC) 20 MG capsule Take 1 capsule (20 mg total) by mouth daily.  Marland Kitchen. oxyCODONE (OXY IR/ROXICODONE) 5 MG immediate release tablet Take 1 tablet (5 mg total) by mouth every 4 (four) hours as needed. Fill on or after 11/10/14 (Patient taking differently: Take 5 mg by mouth every 4 (four) hours as needed for breakthrough pain. Fill on or after 11/10/14)  . OxyCODONE (OXYCONTIN) 40 mg T12A 12 hr tablet Take 1 tablet (40 mg total) by mouth 3 (three) times daily. To fill on or after 11/10/2014  . tamsulosin (FLOMAX) 0.4 MG CAPS capsule TAKE 1 CAPSULE BY MOUTH ONCE DAILY (Patient taking differently: TAKE 0.4 mg BY MOUTH ONCE DAILY)  . [DISCONTINUED] ciprofloxacin (CIPRO) 500 MG tablet Take 1 tablet (500 mg total) 2 (two) times daily by mouth. (Patient not taking: Reported on 10/13/2017)  . [DISCONTINUED] mirtazapine (REMERON) 15 MG tablet Take 15 mg by mouth as needed (depression).    No facility-administered encounter medications on file as of 10/13/2017.     Allergies (verified) Penicillins and Sulfonamide derivatives   History: Past Medical History:  Diagnosis Date  . Allergy   . Anxiety   . Bilateral carpal tunnel syndrome 02/10/2013   Per Dr Amanda PeaGramig  . GERD (gastroesophageal reflux disease)   . Headache(784.0)   . Hypertension   . Osteoarthritis   . Osteoporosis   . Seizures (HCC)    1 seisure at age 89 per pt   Past Surgical History:  Procedure Laterality  Date  . ABCESS DRAINAGE     Infected finger  . INGUINAL HERNIA REPAIR    . TONSILLECTOMY     Family History  Problem Relation Age of Onset  . Dementia Mother   . Deep vein thrombosis Mother   . Colon cancer Neg Hx   . Esophageal cancer Neg Hx   . Rectal cancer Neg Hx   . Stomach cancer Neg Hx    Social History   Socioeconomic History  . Marital status: Single    Spouse name: None  . Number of children: None  . Years of education: None  . Highest education level: None  Social Needs  . Financial resource strain: Hard    . Food insecurity - worry: Often true  . Food insecurity - inability: None  . Transportation needs - medical: No  . Transportation needs - non-medical: No  Occupational History  . None  Tobacco Use  . Smoking status: Current Every Day Smoker    Packs/day: 3.00    Types: Cigarettes  . Smokeless tobacco: Never Used  . Tobacco comment: form given 09-29-16  Substance and Sexual Activity  . Alcohol use: No  . Drug use: No  . Sexual activity: Not Currently  Other Topics Concern  . None  Social History Narrative   1 year of college   HVAC until disabled   Long term monogamous relationship   Current smoker   Alcohol use- no   Drug use- no   Regular exercise- no   Tobacco Counseling Ready to quit: Not Answered Counseling given: Not Answered Comment: form given 09-29-16   Activities of Daily Living In your present state of health, do you have any difficulty performing the following activities: 10/13/2017 06/12/2017  Hearing? N N  Vision? N N  Difficulty concentrating or making decisions? Y N  Walking or climbing stairs? N Y  Dressing or bathing? N N  Doing errands, shopping? N N  Preparing Food and eating ? N -  Using the Toilet? N -  In the past six months, have you accidently leaked urine? N -  Do you have problems with loss of bowel control? N -  Managing your Medications? N -  Managing your Finances? N -  Housekeeping or managing your Housekeeping? N -  Some recent data might be hidden     Immunizations and Health Maintenance Immunization History  Administered Date(s) Administered  . Influenza Split 10/05/2012  . Influenza Whole 08/16/2009  . Influenza,inj,Quad PF,6+ Mos 09/07/2013, 09/08/2014, 11/16/2015  . Td 10/27/1996, 08/16/2009   Health Maintenance Due  Topic Date Due  . INFLUENZA VACCINE  05/27/2017    Patient Care Team: [name], [name], MD as PCP - General (Internal Medicine) Camila LiSims, Gwen Elizabeth (Psychiatry) Carmelina NounSims, Katy, MD (Psychiatry) Karma GreaserJowza,  Maryam, MD as Referring Physician (Anesthesiology)  Indicate any recent Medical Services you may have received from other than [facility] providers in the past year (date may be approximate).    Assessment:   This is a routine wellness examination for Ethelene Brownsnthony. Physical assessment deferred to PCP.   Hearing/Vision screen Hearing Screening Comments: Able to hear conversational tones w/o difficulty. No issues reported.  Passed whisper test Vision Screening Comments:    Dietary issues and exercise activities discussed: Current Exercise Habits: The patient does not participate in regular exercise at present, Exercise limited by: psychological condition(s);orthopedic condition(s)  Diet (meal preparation, eat out, water intake, caffeinated beverages, dairy products, fruits and vegetables): in general, a "healthy" diet  Reviewed heart healthy diet, encouraged patient to increase daily water intake.  Goals    . Patient Stated     Be as healthy as I can be. Keep moving and eat good.      Depression Screen PHQ 2/9 Scores 10/13/2017 09/09/2016 11/16/2015 06/01/2014  PHQ - 2 Score 5 0 0 1  PHQ- 9 Score 15 - - -    Fall Risk Fall Risk  10/13/2017 09/09/2016 11/16/2015 06/01/2014  Falls in the past year? No No No No   Cognitive Function: MMSE - Mini Mental State Exam 10/13/2017  Not completed: Unable to complete       Ad8 score reviewed for issues:  Issues making decisions: no  Less interest in hobbies / activities: yes  Repeats questions, stories (family complaining): no  Trouble using ordinary gadgets (microwave, computer, phone):no  Forgets the month or year: no  Mismanaging finances: no  Remembering appts: no  Daily problems with thinking and/or memory: yes Ad8 score is= 2  Screening Tests Health Maintenance  Topic Date Due  . INFLUENZA VACCINE  05/27/2017  . TETANUS/TDAP  08/17/2019  . COLONOSCOPY  11/27/2026  . Hepatitis C Screening  Completed  . HIV Screening   Completed      Plan:     [facility] referral for social needs of financial difficulty.  Continue to eat heart healthy diet (full of fruits, vegetables, whole grains, lean protein, water--limit salt, fat, and sugar intake) and increase physical activity as tolerated.  I have personally reviewed and noted the following in the patient's chart:   . Medical and social history . Use of alcohol, tobacco or illicit drugs  . Current medications and supplements . Functional ability and status . Nutritional status . Physical activity . Advanced directives . List of other physicians . Vitals . Screenings to include cognitive, depression, and falls . Referrals and appointments  In addition, I have reviewed and discussed with patient certain preventive protocols, quality metrics, and best practice recommendations. A written personalized care plan for preventive services as well as general preventive health recommendations were provided to patient.     Wanda Plump, RN   10/13/2017

## 2017-10-13 ENCOUNTER — Ambulatory Visit (INDEPENDENT_AMBULATORY_CARE_PROVIDER_SITE_OTHER): Payer: Medicare Other | Admitting: *Deleted

## 2017-10-13 VITALS — BP 144/82 | HR 88 | Resp 20 | Ht 67.0 in | Wt 154.0 lb

## 2017-10-13 DIAGNOSIS — Z0000 Encounter for general adult medical examination without abnormal findings: Secondary | ICD-10-CM

## 2017-10-13 DIAGNOSIS — Z23 Encounter for immunization: Secondary | ICD-10-CM | POA: Diagnosis not present

## 2017-10-13 NOTE — Patient Instructions (Addendum)
Continue to eat heart healthy diet (full of fruits, vegetables, whole grains, lean protein, water--limit salt, fat, and sugar intake) and increase physical activity as tolerated.   Mr. Phillip Mullen , Thank you for taking time to come for your Medicare Wellness Visit. I appreciate your ongoing commitment to your health goals. Please review the following plan we discussed and let me know if I can assist you in the future.   These are the goals we discussed: Goals    . Patient Stated     Be as healthy as I can be. Keep moving and eat good.       This is a list of the screening recommended for you and due dates:  Health Maintenance  Topic Date Due  . Flu Shot  05/27/2017  . Tetanus Vaccine  08/17/2019  . Colon Cancer Screening  11/27/2026  .  Hepatitis C: One time screening is recommended by Center for Disease Control  (CDC) for  adults born from [birth year] through 1967.   Completed  . HIV Screening  Completed   Influenza Virus Vaccine (Flucelvax) What is this medicine? INFLUENZA VIRUS VACCINE (in floo EN zuh VAHY ruhs vak SEEN) helps to reduce the risk of getting influenza also known as the flu. The vaccine only helps protect you against some strains of the flu. This medicine may be used for other purposes; ask your health care provider or pharmacist if you have questions. COMMON BRAND NAME(S): FLUCELVAX What should I tell my health care provider before I take this medicine? They need to know if you have any of these conditions: -bleeding disorder like hemophilia -fever or infection -Guillain-Barre syndrome or other neurological problems -immune system problems -infection with the human immunodeficiency virus (HIV) or AIDS -low blood platelet counts -multiple sclerosis -an unusual or allergic reaction to influenza virus vaccine, other medicines, foods, dyes or preservatives -pregnant or trying to get pregnant -breast-feeding How should I use this medicine? This vaccine is for injection  into a muscle. It is given by a health care professional. A copy of Vaccine Information Statements will be given before each vaccination. Read this sheet carefully each time. The sheet may change frequently. Talk to your pediatrician regarding the use of this medicine in children. Special care may be needed. Overdosage: If you think you've taken too much of this medicine contact a poison control center or emergency room at once. Overdosage: If you think you have taken too much of this medicine contact a poison control center or emergency room at once. NOTE: This medicine is only for you. Do not share this medicine with others. What if I miss a dose? This does not apply. What may interact with this medicine? -chemotherapy or radiation therapy -medicines that lower your immune system like etanercept, anakinra, infliximab, and adalimumab -medicines that treat or prevent blood clots like warfarin -phenytoin -steroid medicines like prednisone or cortisone -theophylline -vaccines This list may not describe all possible interactions. Give your health care provider a list of all the medicines, herbs, non-prescription drugs, or dietary supplements you use. Also tell them if you smoke, drink alcohol, or use illegal drugs. Some items may interact with your medicine. What should I watch for while using this medicine? Report any side effects that do not go away within 3 days to your doctor or health care professional. Call your health care provider if any unusual symptoms occur within 6 weeks of receiving this vaccine. You may still catch the flu, but the illness is not usually  as bad. You cannot get the flu from the vaccine. The vaccine will not protect against colds or other illnesses that may cause fever. The vaccine is needed every year. What side effects may I notice from receiving this medicine? Side effects that you should report to your doctor or health care professional as soon as  possible: -allergic reactions like skin rash, itching or hives, swelling of the face, lips, or tongue Side effects that usually do not require medical attention (Report these to your doctor or health care professional if they continue or are bothersome.): -fever -headache -muscle aches and pains -pain, tenderness, redness, or swelling at the injection site -tiredness This list may not describe all possible side effects. Call your doctor for medical advice about side effects. You may report side effects to FDA at 1-800-FDA-1088. Where should I keep my medicine? The vaccine will be given by a health care professional in a clinic, pharmacy, doctor's office, or other health care setting. You will not be given vaccine doses to store at home. NOTE: This sheet is a summary. It may not cover all possible information. If you have questions about this medicine, talk to your doctor, pharmacist, or health care provider.  2018 Elsevier/Gold Standard (2011-09-24 14:06:47)

## 2017-10-14 ENCOUNTER — Emergency Department
Admission: EM | Admit: 2017-10-14 | Discharge: 2017-10-15 | Disposition: A | Discharge status: Home / Self Care | Payer: Medicare Other | Attending: Emergency Medicine | Admitting: Emergency Medicine

## 2017-10-14 ENCOUNTER — Other Ambulatory Visit: Payer: Self-pay

## 2017-10-14 DIAGNOSIS — Y9301 Activity, walking, marching and hiking: Secondary | ICD-10-CM | POA: Insufficient documentation

## 2017-10-14 DIAGNOSIS — S91332A Puncture wound without foreign body, left foot, initial encounter: Secondary | ICD-10-CM

## 2017-10-14 DIAGNOSIS — Y999 Unspecified external cause status: Secondary | ICD-10-CM | POA: Insufficient documentation

## 2017-10-14 DIAGNOSIS — Z79891 Long term (current) use of opiate analgesic: Secondary | ICD-10-CM | POA: Diagnosis not present

## 2017-10-14 DIAGNOSIS — F17210 Nicotine dependence, cigarettes, uncomplicated: Secondary | ICD-10-CM | POA: Diagnosis not present

## 2017-10-14 DIAGNOSIS — Z7982 Long term (current) use of aspirin: Secondary | ICD-10-CM | POA: Diagnosis not present

## 2017-10-14 DIAGNOSIS — M7989 Other specified soft tissue disorders: Secondary | ICD-10-CM | POA: Diagnosis not present

## 2017-10-14 DIAGNOSIS — Y929 Unspecified place or not applicable: Secondary | ICD-10-CM | POA: Insufficient documentation

## 2017-10-14 DIAGNOSIS — S99922A Unspecified injury of left foot, initial encounter: Secondary | ICD-10-CM | POA: Diagnosis present

## 2017-10-14 DIAGNOSIS — Z79899 Other long term (current) drug therapy: Secondary | ICD-10-CM | POA: Insufficient documentation

## 2017-10-14 DIAGNOSIS — I10 Essential (primary) hypertension: Secondary | ICD-10-CM | POA: Insufficient documentation

## 2017-10-14 DIAGNOSIS — W450XXA Nail entering through skin, initial encounter: Secondary | ICD-10-CM | POA: Diagnosis not present

## 2017-10-14 NOTE — ED Triage Notes (Signed)
Pt stepped on a nail, puncture wound noted to left foot.

## 2017-10-14 NOTE — ED Notes (Signed)
Pt stepped on a nail tonight.  Pt was wearing tennis shoes.  Puncture wound to bottom of left foot.  Pt has pain in foot.

## 2017-10-15 ENCOUNTER — Emergency Department: Payer: Medicare Other

## 2017-10-15 ENCOUNTER — Other Ambulatory Visit: Payer: Self-pay

## 2017-10-15 DIAGNOSIS — M7989 Other specified soft tissue disorders: Secondary | ICD-10-CM | POA: Diagnosis not present

## 2017-10-15 MED ORDER — KETOROLAC TROMETHAMINE 60 MG/2ML IM SOLN
60.0000 mg | Freq: Once | INTRAMUSCULAR | Status: AC
  Administered 2017-10-15: 60 mg via INTRAMUSCULAR
  Filled 2017-10-15: qty 2

## 2017-10-15 MED ORDER — DOXYCYCLINE HYCLATE 100 MG PO TABS
100.0000 mg | ORAL_TABLET | Freq: Once | ORAL | Status: AC
  Administered 2017-10-15: 100 mg via ORAL
  Filled 2017-10-15: qty 1

## 2017-10-15 MED ORDER — CIPROFLOXACIN HCL 500 MG PO TABS: 500.0000 mg | ORAL_TABLET | Freq: Two times a day (BID) | ORAL | 0 refills | Status: AC

## 2017-10-15 MED ORDER — DOXYCYCLINE HYCLATE 100 MG PO TABS: 100.0000 mg | ORAL_TABLET | Freq: Two times a day (BID) | ORAL | 0 refills | Status: DC

## 2017-10-15 MED ORDER — CIPROFLOXACIN HCL 500 MG PO TABS
500.0000 mg | ORAL_TABLET | Freq: Once | ORAL | Status: AC
  Administered 2017-10-15: 500 mg via ORAL
  Filled 2017-10-15: qty 1

## 2017-10-15 NOTE — Patient Outreach (Signed)
[facility] Care Management  10/15/2017  Phillip Mullen [birth year] [id number]   Telephone Screen  Referral Date: 10/15/17 Referral Source: MD office(Dr. Jonny RuizJohn) Referral Reason: "HTN, patient has financial hardships, not able to afford meds, pay bills and pay for food." Insurance: Medicare   Outreach attempt # 1 to patient. No answer at present. RN CM left HIPAA compliant voicemail message along with contact info.     Plan: RN CM will make outreach attempt to patient within three business days if no return call.    Phillip [name], RN,BSN,CCM [facility] Care Management Telephonic Care Management Coordinator Direct Phone: [phone] Toll Free: [phone] Fax: [phone]

## 2017-10-15 NOTE — ED Provider Notes (Signed)
[facility] Emergency Department Provider Note   ____________________________________________   First MD Initiated Contact with Patient 10/14/17 2349     (approximate)  I have reviewed the triage vital signs and the nursing notes.   HISTORY  Chief Complaint Puncture Wound    HPI Phillip Mullen is a [age] y.o. male who comes into the hospital today because he stepped on a rusty nail.  The patient was wearing his shoes at the time.  He has a hole in the bottom of his left foot.  He states that his last tetanus was about 3-5 years ago.  The patient states that his pain is a 8 out of 10 in intensity currently.  He is feeling pain going into his ankle and his calf as well.  He is here for evaluation and treatment.  Past Medical History:  Diagnosis Date  . Allergy   . Anxiety   . Bilateral carpal tunnel syndrome 02/10/2013   Per Dr Amanda PeaGramig  . GERD (gastroesophageal reflux disease)   . Headache(784.0)   . Hypertension   . Osteoarthritis   . Osteoporosis   . Seizures (HCC)    1 seisure at age 89 per pt    Patient Active Problem List   Diagnosis Date Noted  . Tobacco use disorder 06/12/2017  . Constipation 06/12/2017  . Cellulitis of right upper extremity 06/11/2017  . Right arm cellulitis 06/10/2017  . Sepsis (HCC) 06/10/2017  . Boil 09/14/2016  . Abdominal pain 09/09/2016  . Loss of weight 09/09/2016  . Dysphagia 09/09/2016  . Fatigue 09/09/2016  . Left knee pain 11/17/2015  . Hyperlipidemia 11/16/2015  . Rash and nonspecific skin eruption 12/25/2014  . Bladder neck obstruction 06/01/2014  . Routine health maintenance 09/09/2013  . Benign prostatic hyperplasia with incomplete bladder emptying 07/29/2013  . Bilateral carpal tunnel syndrome 02/10/2013  . Other malaise and fatigue 02/10/2013  . Shoulder pain, left 04/09/2012  . Nausea with vomiting 02/07/2010  . Chronic back pain 11/21/2008  . ALLERGIC RHINITIS, CHRONIC 02/23/2008  . Anxiety  07/19/2007  . Essential hypertension 07/19/2007  . GERD 07/19/2007  . OSTEOARTHRITIS 07/19/2007  . Headache(784.0) 07/19/2007    Past Surgical History:  Procedure Laterality Date  . ABCESS DRAINAGE     Infected finger  . INGUINAL HERNIA REPAIR    . TONSILLECTOMY      Prior to Admission medications   Medication Sig Start Date End Date Taking? Authorizing Provider  aspirin 81 MG tablet Take 81 mg by mouth daily.      [provider]  cetirizine (ZYRTEC) 10 MG tablet TAKE 1 TABLET BY MOUTH ONCE A DAY 09/22/17   [name], [name], MD  ciprofloxacin (CIPRO) 500 MG tablet Take 1 tablet (500 mg total) by mouth 2 (two) times daily for 5 days. 10/15/17 10/20/17  [name], [name], MD  diazepam (VALIUM) 10 MG tablet Take 15 mg by mouth 2 (two) times daily.    [provider]  doxycycline (VIBRA-TABS) 100 MG tablet Take 1 tablet (100 mg total) by mouth 2 (two) times daily. 10/15/17   [name], [name], MD  magnesium hydroxide (MILK OF MAGNESIA) 800 MG/5ML suspension Take 15 mLs by mouth daily as needed for constipation.     [provider]  mometasone (NASONEX) 50 MCG/ACT nasal spray Place 2 sprays daily into the nose. 09/12/17   [name], [name], MD  Multiple Vitamins-Minerals (MULTIVITAMIN GUMMIES ADULT) CHEW Chew 1 Dose by mouth daily.    [provider]  omeprazole (PRILOSEC) 20 MG capsule Take 1 capsule (20 mg total) by mouth daily. 09/29/16   [name], [name], PA-C  oxyCODONE (OXY IR/ROXICODONE) 5 MG immediate release tablet Take 1 tablet (5 mg total) by mouth every 4 (four) hours as needed. Fill on or after 11/10/14 Patient taking differently: Take 5 mg by mouth every 4 (four) hours as needed for breakthrough pain. Fill on or after 11/10/14 10/11/14   [name], [name], MD  OxyCODONE (OXYCONTIN) 40 mg T12A 12 hr tablet Take 1 tablet (40 mg total) by mouth 3 (three) times daily. To fill on or after 11/10/2014 10/11/14   [name], [name], MD  tamsulosin (FLOMAX) 0.4 MG  CAPS capsule TAKE 1 CAPSULE BY MOUTH ONCE DAILY Patient taking differently: TAKE 0.4 mg BY MOUTH ONCE DAILY 05/25/17   [name], [name], MD    Allergies Meloxicam; Penicillins; and Sulfonamide derivatives  Family History  Problem Relation Age of Onset  . Dementia Mother   . Deep vein thrombosis Mother   . Colon cancer Neg Hx   . Esophageal cancer Neg Hx   . Rectal cancer Neg Hx   . Stomach cancer Neg Hx     Social History Social History   Tobacco Use  . Smoking status: Current Every Day Smoker    Packs/day: 3.00    Types: Cigarettes  . Smokeless tobacco: Never Used  . Tobacco comment: form given 09-29-16  Substance Use Topics  . Alcohol use: No  . Drug use: No    Review of Systems  Constitutional: No fever/chills Eyes: No visual changes. ENT: No sore throat. Cardiovascular: Denies chest pain. Respiratory: Denies shortness of breath. Gastrointestinal: No abdominal pain.  No nausea, no vomiting.  No diarrhea.  No constipation. Genitourinary: Negative for dysuria. Musculoskeletal: Negative for back pain. Skin: Puncture wound to bottom of left foot Neurological: Negative for headaches, focal weakness or numbness.   ____________________________________________   PHYSICAL EXAM:  VITAL SIGNS: ED Triage Vitals  Enc Vitals Group     BP 10/14/17 2303 136/64     Pulse Rate 10/14/17 2303 84     Resp 10/14/17 2303 20     Temp 10/14/17 2303 98.7 F (37.1 C)     Temp Source 10/14/17 2303 Oral     SpO2 10/14/17 2303 95 %     Weight 10/14/17 2301 150 lb (68 kg)     Height 10/14/17 2301 5\' 2"  (1.575 m)     Head Circumference --      Peak Flow --      Pain Score 10/14/17 2301 5     Pain Loc --      Pain Edu? --      Excl. in GC? --     Constitutional: Alert and oriented. Well appearing and in mild distress. Eyes: Conjunctivae are normal. PERRL. EOMI. Head: Atraumatic. Nose: No congestion/rhinnorhea. Mouth/Throat: Mucous membranes are moist.  Oropharynx  non-erythematous. Cardiovascular: Normal rate, regular rhythm. Grossly normal heart sounds.  Good peripheral circulation. Respiratory: Normal respiratory effort.  No retractions. Lungs CTAB. Gastrointestinal: Soft and nontender. No distention.  Musculoskeletal: No lower extremity tenderness nor edema.  Neurologic:  Normal speech and language.  Skin:  Skin is warm, dry puncture wound to bottom of left foot Psychiatric: Mood and affect are normal.   ____________________________________________   LABS (all labs ordered are listed, but only abnormal results are displayed)  Labs Reviewed - No data to display ____________________________________________  EKG  none ____________________________________________  RADIOLOGY  Dg Foot 2 Views Left  Result Date: 10/15/2017 CLINICAL DATA:  Patient stepped on nail EXAM: LEFT FOOT - 2 VIEW COMPARISON:  None. FINDINGS: Frontal and lateral views obtained. There is soft tissue swelling along the volar aspect of the foot. No soft tissue air or radiopaque foreign body. No fracture or dislocation. Joint spaces appear normal. No erosive change. There are small posterior and inferior calcaneal spurs. IMPRESSION: Soft tissue swelling along the volar midfoot. No air or radiopaque foreign body. Bony structures appear unremarkable. There are small calcaneal spurs. Electronically Signed   By: Bretta Bang III M.D.   On: 10/15/2017 00:27    ____________________________________________   PROCEDURES  Procedure(s) performed: None  Procedures  Critical Care performed: No  ____________________________________________   INITIAL IMPRESSION / ASSESSMENT AND PLAN / ED COURSE  As part of my medical decision making, I reviewed the following data within the electronic MEDICAL RECORD NUMBER Notes from prior ED visits and [place] Controlled Substance Database   This is a 51 year old who comes into the hospital today after stepping on a rusty nail.  We did perform  an x-ray to ensure that the patient did not have any signs of infection or retained foreign bodies.  The patient's x-ray showed some soft tissue swelling but no air or radiopaque foreign bodies  Washed out the patient's wound and gave him a dose of Augmentin.  He also received a shot of Toradol.  Since his tetanus is current I will not repeat the patient's tetanus.  He will be discharged home with some antibiotics.  I will also give him some crutches as it hurts to walk on his foot.  He should return with any signs of infection.      ____________________________________________   FINAL CLINICAL IMPRESSION(S) / ED DIAGNOSES  Final diagnoses:  Puncture wound of plantar aspect of left foot without complication, initial encounter     ED Discharge Orders        Ordered    ciprofloxacin (CIPRO) 500 MG tablet  2 times daily     10/15/17 0035    doxycycline (VIBRA-TABS) 100 MG tablet  2 times daily     10/15/17 1610       Note:  This document was prepared using Dragon voice recognition software and may include unintentional dictation errors.    Rebecka Apley, MD 10/15/17 [phone]

## 2017-10-15 NOTE — Discharge Instructions (Signed)
Please follow up with your primary care physician for further evaluation. Please return with any worsened pain, redness or drainage

## 2017-10-21 ENCOUNTER — Other Ambulatory Visit: Payer: Self-pay

## 2017-10-21 NOTE — Patient Outreach (Signed)
[facility]) Care Management  10/21/2017  Phillip Mullen Oct 11, 1966 [id number]   Telephone Screen  Referral Date: 10/15/17 Referral Source: MD office(Dr. Jonny RuizJohn) Referral Reason: "HTN, patient has financial hardships, not able to afford meds, pay bills and pay for food." Insurance: Medicare    Outreach attempt #2 to patient. No answer at present. RN CM left HIPAA compliant voicemail message along with contact info. RN CM also attempted alternate number for patient and no answer at that number as well.     Plan: RN CM will make outreach attempt to patient within three business days if no return call from patient.     Phillip Fairyoshanda Florean Hoobler, RN,BSN,CCM [facility] Care [facility] Telephonic Care Management Coordinator Direct Phone: [phone] Toll Free: [phone] Fax: [phone]

## 2017-10-23 ENCOUNTER — Other Ambulatory Visit: Payer: Self-pay

## 2017-10-23 NOTE — Patient Outreach (Signed)
[facility]) Care Management  10/23/2017  Phillip Mullen January 08, 1966 [id number]   Telephone Screen  Referral Date:10/15/17 Referral Source:MD office(Dr. Jonny RuizJohn) Referral Reason:"HTN, patient has financial hardships, not able to afford meds, pay bills and pay for food." Insurance:Medicare   Outreach attempt #3 to patient. No answer at both numbers listed for patient. HIPAA compliant voicemail message left along with contact info.     Plan: RN CM will send unsuccessful outreach letter to patient and close case if no response from patient within 10 business days.   [name], RN,BSN,CCM [facility] Care [facility] Telephonic Care Management Coordinator Direct Phone: [phone] Toll Free: [phone] Fax: [phone]

## 2017-11-06 ENCOUNTER — Other Ambulatory Visit: Payer: Self-pay

## 2017-11-06 NOTE — Patient Outreach (Signed)
[facility]) Care Management  11/06/2017  Phillip Mullen [birth year] [id number]   Telephone Screen  Referral Date:10/15/17 Referral Source:MD office(Dr. Jonny RuizJohn) Referral Reason:"HTN, patient has financial hardships, not able to afford meds, pay bills and pay for food." Insurance:Medicare    Multiple attempts to establish contact with patient without success. No response from letter mailed to patient. Case is being closed at this time.     Plan: RN CM will notify [facility] administrative assistant of case status. RN CM will send MD case closure letter.    Antionette Fairyoshanda Jailynne Opperman, RN,BSN,CCM [facility] Care Management Telephonic Care Management Coordinator Direct Phone: [phone] Toll Free: [phone] Fax: [phone]

## 2017-12-21 ENCOUNTER — Encounter: Payer: Self-pay | Admitting: Family

## 2017-12-21 ENCOUNTER — Ambulatory Visit (INDEPENDENT_AMBULATORY_CARE_PROVIDER_SITE_OTHER)
Admission: RE | Admit: 2017-12-21 | Discharge: 2017-12-21 | Disposition: A | Discharge status: Home / Self Care | Payer: Medicare Other | Source: Ambulatory Visit | Attending: Family | Admitting: Family

## 2017-12-21 ENCOUNTER — Ambulatory Visit (INDEPENDENT_AMBULATORY_CARE_PROVIDER_SITE_OTHER): Payer: Medicare Other | Admitting: Family

## 2017-12-21 VITALS — BP 140/88 | HR 67 | Temp 98.7°F | Ht 62.0 in | Wt 148.0 lb

## 2017-12-21 DIAGNOSIS — M79641 Pain in right hand: Secondary | ICD-10-CM | POA: Diagnosis not present

## 2017-12-21 DIAGNOSIS — M7989 Other specified soft tissue disorders: Secondary | ICD-10-CM | POA: Diagnosis not present

## 2017-12-21 DIAGNOSIS — S6991XA Unspecified injury of right wrist, hand and finger(s), initial encounter: Secondary | ICD-10-CM | POA: Diagnosis not present

## 2017-12-21 DIAGNOSIS — M25531 Pain in right wrist: Secondary | ICD-10-CM | POA: Diagnosis not present

## 2017-12-21 MED ORDER — NAPROXEN 500 MG PO TABS: 500.0000 mg | ORAL_TABLET | Freq: Two times a day (BID) | ORAL | 0 refills | Status: AC

## 2017-12-21 NOTE — Progress Notes (Signed)
Reviewed with patient in office;

## 2017-12-21 NOTE — Progress Notes (Signed)
Phillip Mullen is a 52 y.o. male with the following history as recorded in EpicCare:  Patient Active Problem List   Diagnosis Date Noted  . Tobacco use disorder 06/12/2017  . Constipation 06/12/2017  . Cellulitis of right upper extremity 06/11/2017  . Right arm cellulitis 06/10/2017  . Sepsis (HCC) 06/10/2017  . Boil 09/14/2016  . Abdominal pain 09/09/2016  . Loss of weight 09/09/2016  . Dysphagia 09/09/2016  . Fatigue 09/09/2016  . Left knee pain 11/17/2015  . Hyperlipidemia 11/16/2015  . Rash and nonspecific skin eruption 12/25/2014  . Bladder neck obstruction 06/01/2014  . Routine health maintenance 09/09/2013  . Benign prostatic hyperplasia with incomplete bladder emptying 07/29/2013  . Bilateral carpal tunnel syndrome 02/10/2013  . Other malaise and fatigue 02/10/2013  . Shoulder pain, left 04/09/2012  . Nausea with vomiting 02/07/2010  . Chronic back pain 11/21/2008  . ALLERGIC RHINITIS, CHRONIC 02/23/2008  . Anxiety 07/19/2007  . Essential hypertension 07/19/2007  . GERD 07/19/2007  . OSTEOARTHRITIS 07/19/2007  . Headache(784.0) 07/19/2007    Current Outpatient Medications  Medication Sig Dispense Refill  . aspirin 81 MG tablet Take 81 mg by mouth daily.      . cetirizine (ZYRTEC) 10 MG tablet TAKE 1 TABLET BY MOUTH ONCE A DAY 30 tablet 3  . diazepam (VALIUM) 10 MG tablet Take 15 mg by mouth 2 (two) times daily.    Marland Kitchen doxycycline (VIBRA-TABS) 100 MG tablet Take 1 tablet (100 mg total) by mouth 2 (two) times daily. 10 tablet 0  . magnesium hydroxide (MILK OF MAGNESIA) 800 MG/5ML suspension Take 15 mLs by mouth daily as needed for constipation.     . mometasone (NASONEX) 50 MCG/ACT nasal spray Place 2 sprays daily into the nose. 17 g 0  . Multiple Vitamins-Minerals (MULTIVITAMIN GUMMIES ADULT) CHEW Chew 1 Dose by mouth daily.    Marland Kitchen omeprazole (PRILOSEC) 20 MG capsule Take 1 capsule (20 mg total) by mouth daily. 30 capsule 11  . oxyCODONE (OXY IR/ROXICODONE) 5 MG  immediate release tablet Take 1 tablet (5 mg total) by mouth every 4 (four) hours as needed. Fill on or after 11/10/14 (Patient taking differently: Take 5 mg by mouth every 4 (four) hours as needed for breakthrough pain. Fill on or after 11/10/14) 120 tablet 0  . OxyCODONE (OXYCONTIN) 40 mg T12A 12 hr tablet Take 1 tablet (40 mg total) by mouth 3 (three) times daily. To fill on or after 11/10/2014 90 tablet 0  . tamsulosin (FLOMAX) 0.4 MG CAPS capsule TAKE 1 CAPSULE BY MOUTH ONCE DAILY (Patient taking differently: TAKE 0.4 mg BY MOUTH ONCE DAILY) 30 capsule 3  . naproxen (NAPROSYN) 500 MG tablet Take 1 tablet (500 mg total) by mouth 2 (two) times daily with a meal. 30 tablet 0   No current facility-administered medications for this visit.     Allergies: Meloxicam; Penicillins; and Sulfonamide derivatives  Past Medical History:  Diagnosis Date  . Allergy   . Anxiety   . Bilateral carpal tunnel syndrome 02/10/2013   Per Dr Amanda Pea  . GERD (gastroesophageal reflux disease)   . Headache(784.0)   . Hypertension   . Osteoarthritis   . Osteoporosis   . Seizures (HCC)    1 seisure at age 54 per pt    Past Surgical History:  Procedure Laterality Date  . ABCESS DRAINAGE     Infected finger  . INGUINAL HERNIA REPAIR    . TONSILLECTOMY      Family History  Problem Relation  Age of Onset  . Dementia Mother   . Deep vein thrombosis Mother   . Colon cancer Neg Hx   . Esophageal cancer Neg Hx   . Rectal cancer Neg Hx   . Stomach cancer Neg Hx     Social History   Tobacco Use  . Smoking status: Current Every Day Smoker    Packs/day: 3.00    Types: Cigarettes  . Smokeless tobacco: Never Used  . Tobacco comment: form given 09-29-16  Substance Use Topics  . Alcohol use: No    Subjective:  Patient presents with 1 week history of right hand/ wrist pain; symptoms started after he tripped over his cat and fell into a wall; notes that symptoms today are actually much improved compared to end of last  week; has chronic hand/ wrist issues- under the care of an orthopedist; has been taking Aspirin for pain;  Objective:  Vitals:   12/21/17 1029  BP: 140/88  Pulse: 67  Temp: 98.7 F (37.1 C)  TempSrc: Oral  SpO2: 98%  Weight: 148 lb (67.1 kg)  Height: 5\' 2"  (1.575 m)    General: Well developed, well nourished, in no acute distress  Skin : Warm and dry.  Head: Normocephalic and atraumatic  Lungs: Respirations unlabored;  Musculoskeletal: No deformities; limited range of motion noted in finger of right hand; mild swelling noted over right wrist Extremities: No edema, cyanosis, clubbing  Vessels: Symmetric bilaterally  Neurologic: Alert and oriented; speech intact; face symmetrical; moves all extremities well; CNII-XII intact without focal deficit  Assessment:  1. Pain of right hand     Plan:  Update hand/ wrist x-ray today- no fracture seen; patient defers seeing his specialist at this time due to concerns about cost; will treat with Naproxen 500 mg bid x 7-10 days to help with lingering swelling; per patient, he can take NSAIDs in general- just not Mobic specifially;  Patient also mentions that he is having increased problems with his depression and is worried about his mother's mental health; he feels that worrying about his mom is making his depression worse. He is frustrated because he has not been able to reach his psychiatrist; I reviewed notes and found the number for his psychiatrist as well as nurse help line linked with his psychiatrist; he expressed understanding and noted he plans to call today; did not express suicidal thoughts.  No Follow-up on file.  Orders Placed This Encounter  Procedures  . DG Wrist Complete Right    Standing Status:   Future    Number of Occurrences:   1    Standing Expiration Date:   02/19/2019    Order Specific Question:   Reason for Exam (SYMPTOM  OR DIAGNOSIS REQUIRED)    Answer:   pain/ swelling    Order Specific Question:   Preferred  imaging location?    Answer:   [address]    Order Specific Question:   Radiology Contrast Protocol - do NOT remove file path    Answer:   \\[email or web address]\DXFluoroContrastProtocols.pdf  . DG Hand Complete Right    Standing Status:   Future    Number of Occurrences:   1    Standing Expiration Date:   02/19/2019    Order Specific Question:   Reason for Exam (SYMPTOM  OR DIAGNOSIS REQUIRED)    Answer:   hand pain    Order Specific Question:   Preferred imaging location?    Answer:   [address]    Order Specific  Question:   Radiology Contrast Protocol - do NOT remove file path    Answer:   \\[email or web address]\DXFluoroContrastProtocols.pdf    Requested Prescriptions   Signed Prescriptions Disp Refills  . naproxen (NAPROSYN) 500 MG tablet 30 tablet 0    Sig: Take 1 tablet (500 mg total) by mouth 2 (two) times daily with a meal.

## 2018-01-19 ENCOUNTER — Other Ambulatory Visit: Payer: Self-pay | Admitting: Internal Medicine

## 2018-01-19 NOTE — Telephone Encounter (Signed)
Routing to dr Jonny Ruizjohn, please advise if you need office visit before refills can be sent, thanks

## 2018-03-12 IMAGING — CT CT ABD-PELV W/ CM
2 of 5 series · 15 of 46 positions shown, 17 images · IV contrast (ISOVUE 300)
Comparison: None.

CLINICAL DATA: Lower abdominal pain and nausea. Weight loss over
past year

EXAM:
CT ABDOMEN AND PELVIS WITH CONTRAST
TECHNIQUE: Multidetector CT imaging of the abdomen and pelvis was performed
using the standard protocol following bolus administration of
intravenous contrast. Oral contrast was also administered.
CONTRAST:  100mL FOL7UX-CQQ IOPAMIDOL (FOL7UX-CQQ) INJECTION 61%

[Series 2: abd/pel w · axial · 0.72mm/px · z∈[-464,-99]mm · 12 of 83 slices shown, 14 images]
[im 5/83  soft-tissue]
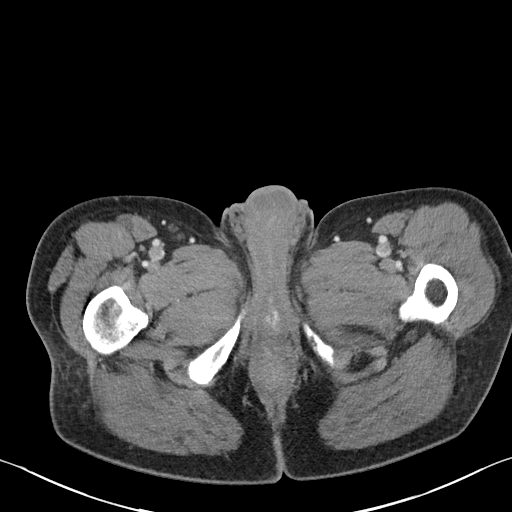
[im 5/83  bone]
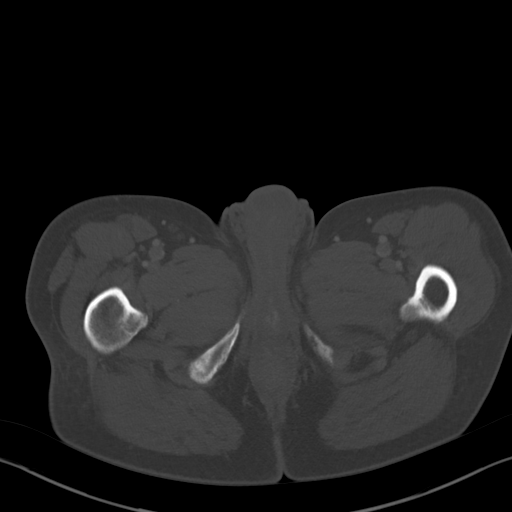
[im 14/83  soft-tissue]
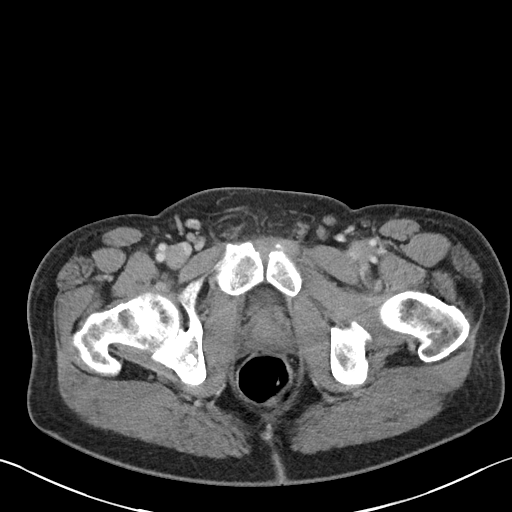
[im 19/83  soft-tissue]
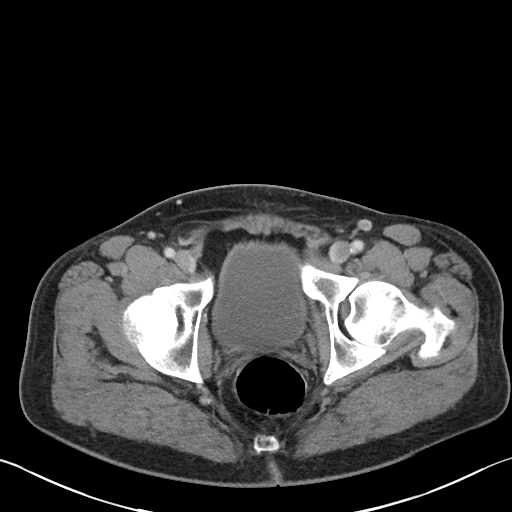
[im 23/83  soft-tissue]
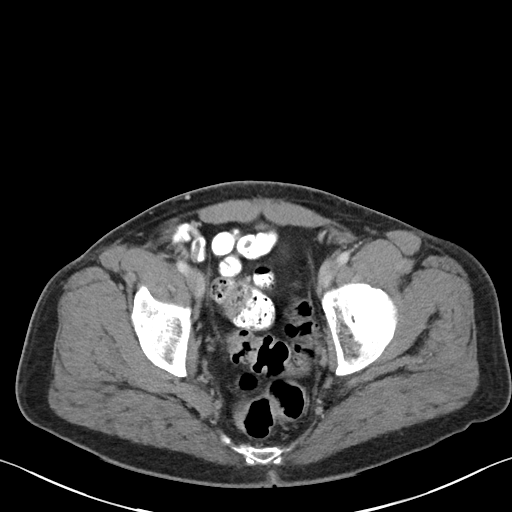
[im 32/83  soft-tissue]
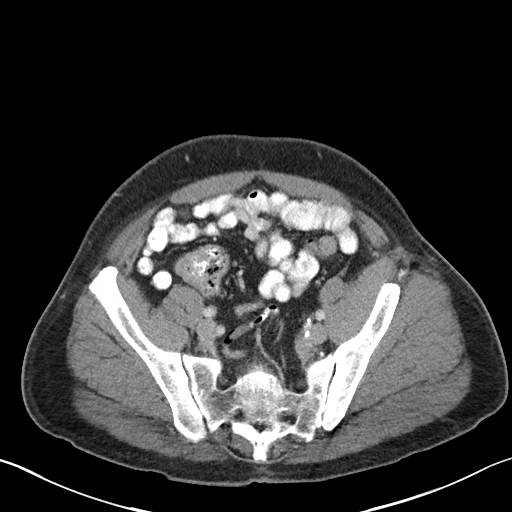
[im 37/83  soft-tissue]
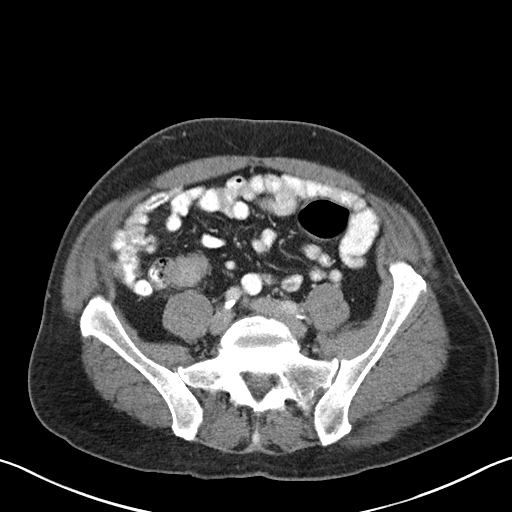
[im 46/83  soft-tissue]
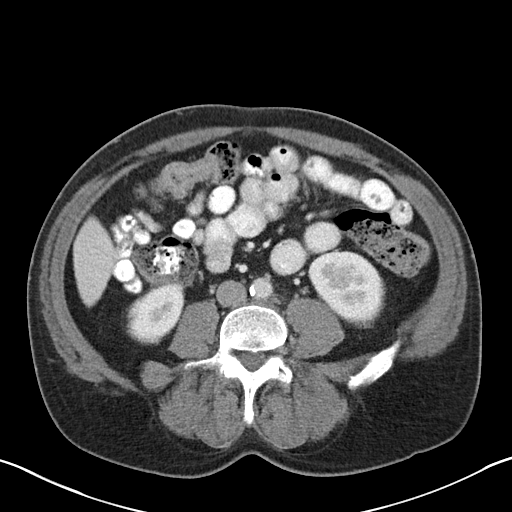
[im 51/83  soft-tissue]
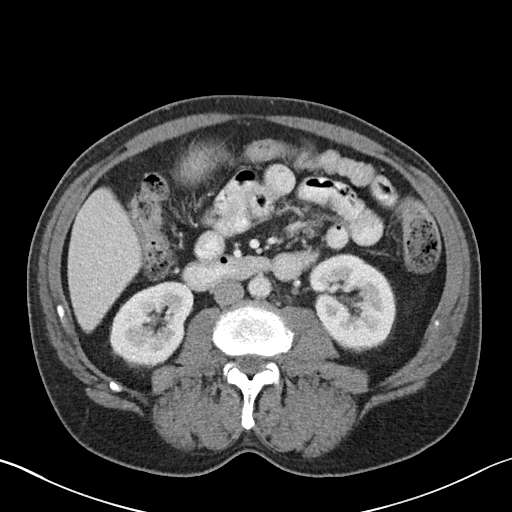
[im 60/83  soft-tissue]
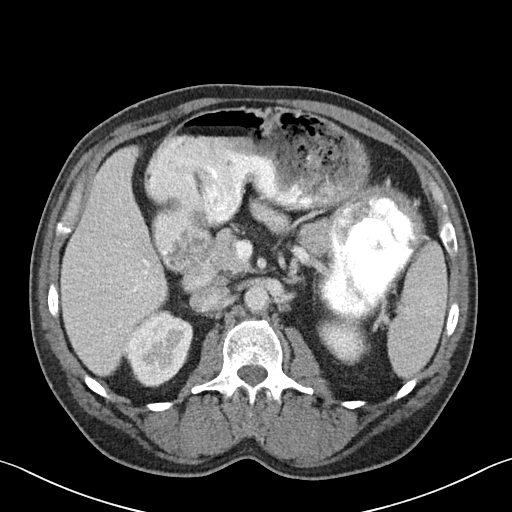
[im 60/83  bone]
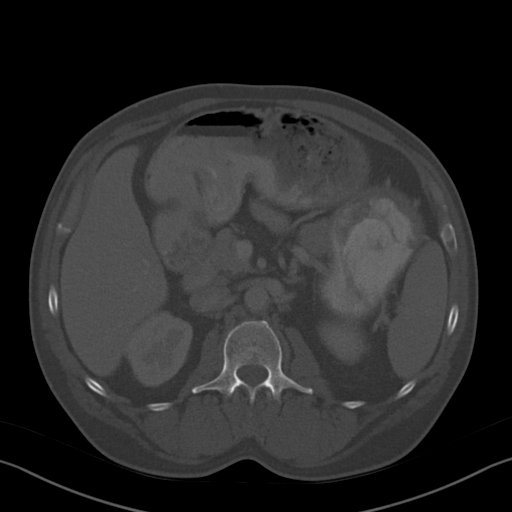
[im 64/83  soft-tissue]
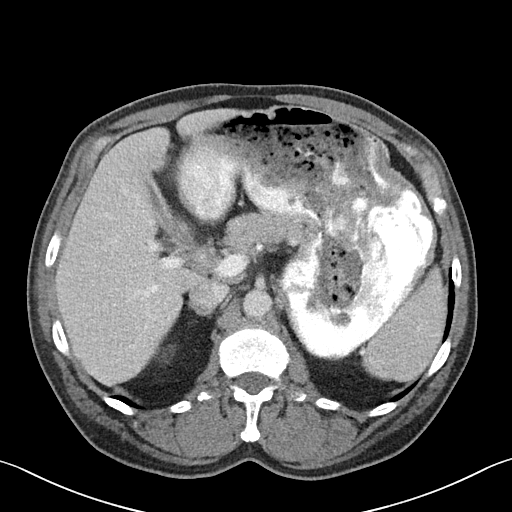
[im 69/83  soft-tissue]
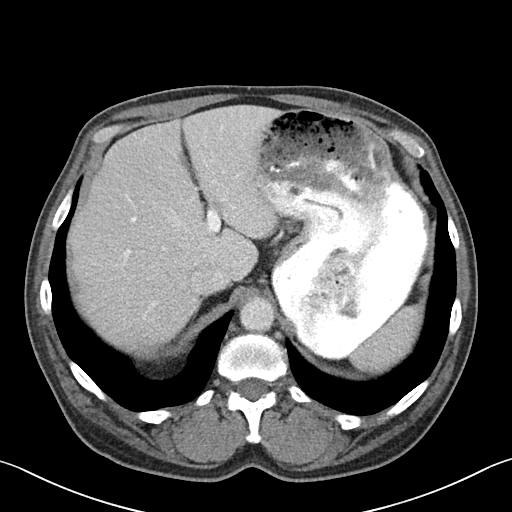
[im 78/83  soft-tissue]
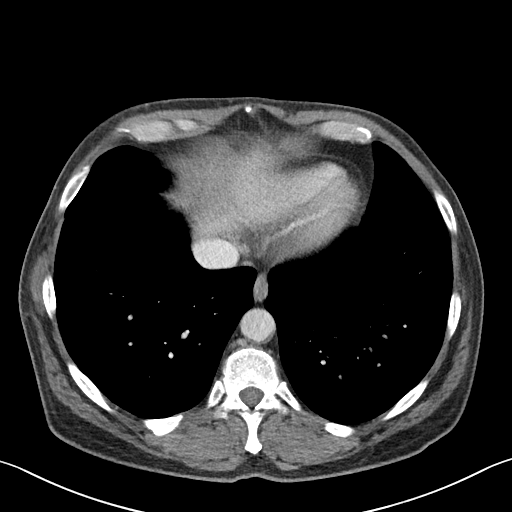

[Series 6: abd/pel w st · coronal · 0.70mm/px · 3 of 95 slices shown]
[im 32/95  soft-tissue]
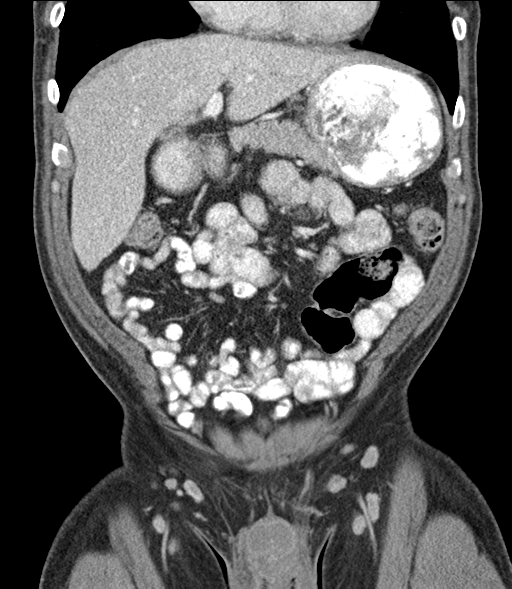
[im 42/95  soft-tissue]
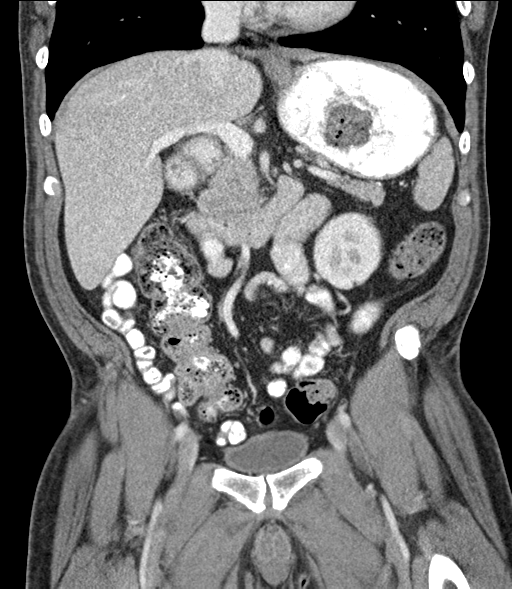
[im 53/95  soft-tissue]
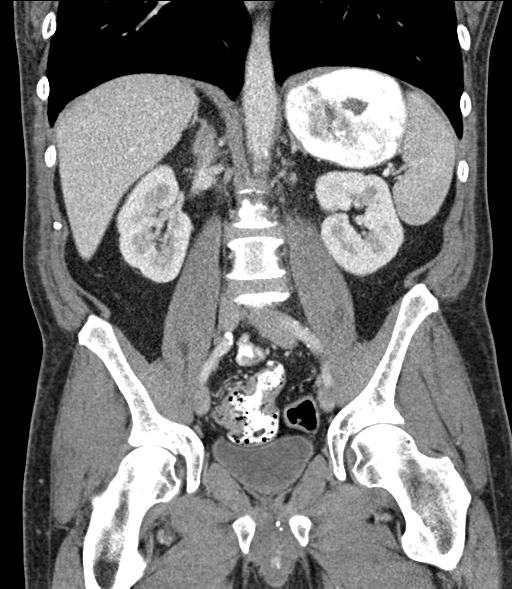

[15 of 46 positions shown; findings below may reference images not displayed]

FINDINGS: Lower chest: Lung bases are clear.

Hepatobiliary: No focal liver lesions are evident. Gallbladder is
contracted. There is no appreciable biliary duct dilatation.

Pancreas: There is no pancreatic mass or inflammatory focus.

Spleen: No splenic lesions are evident.

Adrenals/Urinary Tract: Adrenals bilaterally appear unremarkable.
There is no evident renal mass or hydronephrosis on either side.
There is a 2 mm calculus in the lower pole left kidney,
nonobstructing. There is no evident ureteral calculus on either
side. Urinary bladder is midline with wall thickness within normal
limits.

Stomach/Bowel: There is stool throughout much of the colon. There is
a focal area of luminal thickening and narrowing in the proximal
transverse colon, best seen on axial slice 38 series 2 and coronal
slice 21 series 6. There is also an area of soft tissue prominence
in the ascending colon best seen on axial slice 47 series 2. These
areas warrant direct visualization to assess for possible colonic
neoplastic involvement. There is no soft tissue extension beyond
bowel lumen. There is no bowel wall or mesenteric thickening. No
evident bowel obstruction. No free air or portal venous air.

Vascular/Lymphatic: There is atherosclerotic calcification in the
aorta and common iliac arteries bilaterally. There is hypogastric
artery calcification bilaterally as well. There is no abdominal
aortic aneurysm. Major mesenteric vessels appear patent on this
noncontrast enhanced study. There is no evident adenopathy in the
abdomen or pelvis. Small inguinal lymph nodes do not meet size
criteria for pathologic significance in must be regarded as
nonspecific.

Reproductive: Prostate and seminal vesicles appear normal in size
and contour. No pelvic mass or pelvic fluid collection evident.

Other: Appendix appears normal. No ascites or abscess evident in the
abdomen or pelvis. There is a minimal ventral hernia containing only
fat. There are no omental lesions evident.

Musculoskeletal: There is degenerative change in the lower lumbar
spine region. There are no blastic or lytic bone lesions. There is
no intramuscular or abdominal wall lesion.
IMPRESSION: Areas of questionable luminal narrowing in the proximal transverse
colon and soft tissue prominence in the ascending colon. These areas
warrant direct colonic visualization after colonic preparation to
exclude possible neoplastic involvement, particularly given the
history of unexplained weight loss. There is no bowel obstruction.
No omental lesions are evident.

No abscess.  Appendix appears normal.

Nonobstructing 2 mm calculus lower pole left kidney. No
hydronephrosis or ureteral calculus on either side.

There is aortoiliac atherosclerosis.

## 2018-03-19 DIAGNOSIS — R200 Anesthesia of skin: Secondary | ICD-10-CM | POA: Diagnosis not present

## 2018-03-19 DIAGNOSIS — M79605 Pain in left leg: Secondary | ICD-10-CM | POA: Diagnosis not present

## 2018-03-19 DIAGNOSIS — Z79891 Long term (current) use of opiate analgesic: Secondary | ICD-10-CM | POA: Diagnosis not present

## 2018-03-19 DIAGNOSIS — R202 Paresthesia of skin: Secondary | ICD-10-CM | POA: Diagnosis not present

## 2018-03-19 DIAGNOSIS — M25511 Pain in right shoulder: Secondary | ICD-10-CM | POA: Diagnosis not present

## 2018-03-19 DIAGNOSIS — M79604 Pain in right leg: Secondary | ICD-10-CM | POA: Diagnosis not present

## 2018-03-19 DIAGNOSIS — M545 Low back pain: Secondary | ICD-10-CM | POA: Diagnosis not present

## 2018-03-19 DIAGNOSIS — Z882 Allergy status to sulfonamides status: Secondary | ICD-10-CM | POA: Diagnosis not present

## 2018-03-19 DIAGNOSIS — Z7982 Long term (current) use of aspirin: Secondary | ICD-10-CM | POA: Diagnosis not present

## 2018-03-19 DIAGNOSIS — M47816 Spondylosis without myelopathy or radiculopathy, lumbar region: Secondary | ICD-10-CM | POA: Diagnosis not present

## 2018-03-19 DIAGNOSIS — M25512 Pain in left shoulder: Secondary | ICD-10-CM | POA: Diagnosis not present

## 2018-03-19 DIAGNOSIS — G894 Chronic pain syndrome: Secondary | ICD-10-CM | POA: Diagnosis not present

## 2018-03-19 DIAGNOSIS — Z79899 Other long term (current) drug therapy: Secondary | ICD-10-CM | POA: Diagnosis not present

## 2018-03-19 DIAGNOSIS — M542 Cervicalgia: Secondary | ICD-10-CM | POA: Diagnosis not present

## 2018-03-19 DIAGNOSIS — Z880 Allergy status to penicillin: Secondary | ICD-10-CM | POA: Diagnosis not present

## 2018-03-19 DIAGNOSIS — Z888 Allergy status to other drugs, medicaments and biological substances status: Secondary | ICD-10-CM | POA: Diagnosis not present

## 2018-04-17 ENCOUNTER — Other Ambulatory Visit: Payer: Self-pay | Admitting: Internal Medicine

## 2018-05-17 ENCOUNTER — Other Ambulatory Visit: Payer: Self-pay | Admitting: Internal Medicine

## 2018-06-17 DIAGNOSIS — G8929 Other chronic pain: Secondary | ICD-10-CM | POA: Diagnosis not present

## 2018-06-17 DIAGNOSIS — M47816 Spondylosis without myelopathy or radiculopathy, lumbar region: Secondary | ICD-10-CM | POA: Diagnosis not present

## 2018-06-17 DIAGNOSIS — M545 Low back pain: Secondary | ICD-10-CM | POA: Diagnosis not present

## 2018-06-17 DIAGNOSIS — M25511 Pain in right shoulder: Secondary | ICD-10-CM | POA: Diagnosis not present

## 2018-06-17 DIAGNOSIS — M542 Cervicalgia: Secondary | ICD-10-CM | POA: Diagnosis not present

## 2018-06-17 DIAGNOSIS — M7918 Myalgia, other site: Secondary | ICD-10-CM | POA: Diagnosis not present

## 2018-06-17 DIAGNOSIS — M25512 Pain in left shoulder: Secondary | ICD-10-CM | POA: Diagnosis not present

## 2018-07-17 ENCOUNTER — Other Ambulatory Visit: Payer: Self-pay | Admitting: Physician Assistant

## 2018-08-13 DIAGNOSIS — M25512 Pain in left shoulder: Secondary | ICD-10-CM | POA: Diagnosis not present

## 2018-08-13 DIAGNOSIS — M47816 Spondylosis without myelopathy or radiculopathy, lumbar region: Secondary | ICD-10-CM | POA: Diagnosis not present

## 2018-08-13 DIAGNOSIS — M545 Low back pain: Secondary | ICD-10-CM | POA: Diagnosis not present

## 2018-08-13 DIAGNOSIS — M25511 Pain in right shoulder: Secondary | ICD-10-CM | POA: Diagnosis not present

## 2018-08-13 DIAGNOSIS — G894 Chronic pain syndrome: Secondary | ICD-10-CM | POA: Diagnosis not present

## 2018-08-13 DIAGNOSIS — M542 Cervicalgia: Secondary | ICD-10-CM | POA: Diagnosis not present

## 2018-08-19 ENCOUNTER — Other Ambulatory Visit: Payer: Self-pay | Admitting: Physician Assistant

## 2018-10-13 ENCOUNTER — Other Ambulatory Visit: Payer: Self-pay | Admitting: Internal Medicine

## 2018-10-22 ENCOUNTER — Other Ambulatory Visit: Payer: Self-pay | Admitting: Internal Medicine

## 2018-11-04 ENCOUNTER — Other Ambulatory Visit: Payer: Self-pay | Admitting: Internal Medicine

## 2018-11-04 MED ORDER — TAMSULOSIN HCL 0.4 MG PO CAPS: 0.4000 mg | ORAL_CAPSULE | Freq: Every day | ORAL | 2 refills | Status: DC

## 2018-11-04 NOTE — Telephone Encounter (Signed)
Copied from CRM [id number]. Topic: Quick Communication - Rx Refill/Question >> Nov 04, 2018  4:48 PM Jilda Roche wrote: Medication: tamsulosin (FLOMAX) 0.4 MG CAPS capsule,   Has the patient contacted their pharmacy?  (Agent: If no, request that the patient contact the pharmacy for the refill.) (Agent: If yes, when and what did the pharmacy advise?) Needs an appt to refill   Preferred Pharmacy (with phone number or street name): [facility] [place]  - [address] [phone] (Phone) [phone] (Fax)    Agent: Please be advised that RX refills may take up to 3 business days. We ask that you follow-up with your pharmacy.

## 2018-11-11 ENCOUNTER — Ambulatory Visit (INDEPENDENT_AMBULATORY_CARE_PROVIDER_SITE_OTHER): Payer: Medicare Other | Admitting: Internal Medicine

## 2018-11-11 ENCOUNTER — Encounter: Payer: Self-pay | Admitting: Internal Medicine

## 2018-11-11 VITALS — BP 116/72 | HR 70 | Temp 98.7°F | Ht 62.0 in | Wt 151.0 lb

## 2018-11-11 DIAGNOSIS — N320 Bladder-neck obstruction: Secondary | ICD-10-CM

## 2018-11-11 DIAGNOSIS — I10 Essential (primary) hypertension: Secondary | ICD-10-CM | POA: Diagnosis not present

## 2018-11-11 DIAGNOSIS — R739 Hyperglycemia, unspecified: Secondary | ICD-10-CM | POA: Diagnosis not present

## 2018-11-11 DIAGNOSIS — E785 Hyperlipidemia, unspecified: Secondary | ICD-10-CM
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
## 2018-11-11 NOTE — Patient Instructions (Signed)

## 2018-11-11 NOTE — Assessment & Plan Note (Signed)
stable overall by history and exam, recent data reviewed with pt, and pt to continue medical treatment as before,  to f/u any worsening symptoms or concerns  

## 2018-12-13 ENCOUNTER — Other Ambulatory Visit: Payer: Self-pay | Admitting: Internal Medicine

## 2019-04-07 ENCOUNTER — Other Ambulatory Visit: Payer: Self-pay | Admitting: Internal Medicine

## 2019-05-05 DIAGNOSIS — M542 Cervicalgia: Secondary | ICD-10-CM | POA: Diagnosis not present

## 2019-05-05 DIAGNOSIS — M25512 Pain in left shoulder: Secondary | ICD-10-CM | POA: Diagnosis not present

## 2019-05-05 DIAGNOSIS — G894 Chronic pain syndrome: Secondary | ICD-10-CM | POA: Diagnosis not present

## 2019-05-05 DIAGNOSIS — M545 Low back pain: Secondary | ICD-10-CM | POA: Diagnosis not present

## 2019-05-05 DIAGNOSIS — M47816 Spondylosis without myelopathy or radiculopathy, lumbar region: Secondary | ICD-10-CM | POA: Diagnosis not present

## 2019-05-05 DIAGNOSIS — G5600 Carpal tunnel syndrome, unspecified upper limb: Secondary | ICD-10-CM | POA: Diagnosis not present

## 2019-05-05 DIAGNOSIS — M25511 Pain in right shoulder: Secondary | ICD-10-CM | POA: Diagnosis not present

## 2019-06-09 ENCOUNTER — Other Ambulatory Visit: Payer: Self-pay | Admitting: Internal Medicine

## 2019-08-05 DIAGNOSIS — F419 Anxiety disorder, unspecified: Secondary | ICD-10-CM | POA: Diagnosis not present

## 2019-08-05 DIAGNOSIS — M25512 Pain in left shoulder: Secondary | ICD-10-CM | POA: Diagnosis not present

## 2019-08-05 DIAGNOSIS — M79605 Pain in left leg: Secondary | ICD-10-CM | POA: Diagnosis not present

## 2019-08-05 DIAGNOSIS — F410 Panic disorder [episodic paroxysmal anxiety] without agoraphobia: Secondary | ICD-10-CM | POA: Diagnosis not present

## 2019-08-05 DIAGNOSIS — M79643 Pain in unspecified hand: Secondary | ICD-10-CM | POA: Diagnosis not present

## 2019-08-05 DIAGNOSIS — M25511 Pain in right shoulder: Secondary | ICD-10-CM | POA: Diagnosis not present

## 2019-08-05 DIAGNOSIS — Z888 Allergy status to other drugs, medicaments and biological substances status: Secondary | ICD-10-CM | POA: Diagnosis not present

## 2019-08-05 DIAGNOSIS — Z79899 Other long term (current) drug therapy: Secondary | ICD-10-CM | POA: Diagnosis not present

## 2019-08-05 DIAGNOSIS — M25552 Pain in left hip: Secondary | ICD-10-CM | POA: Diagnosis not present

## 2019-08-05 DIAGNOSIS — Z886 Allergy status to analgesic agent status: Secondary | ICD-10-CM | POA: Diagnosis not present

## 2019-08-05 DIAGNOSIS — Z882 Allergy status to sulfonamides status: Secondary | ICD-10-CM | POA: Diagnosis not present

## 2019-08-05 DIAGNOSIS — G894 Chronic pain syndrome: Secondary | ICD-10-CM | POA: Diagnosis not present

## 2019-08-05 DIAGNOSIS — Z7982 Long term (current) use of aspirin: Secondary | ICD-10-CM | POA: Diagnosis not present

## 2019-08-05 DIAGNOSIS — M25551 Pain in right hip: Secondary | ICD-10-CM | POA: Diagnosis not present

## 2019-08-05 DIAGNOSIS — M542 Cervicalgia: Secondary | ICD-10-CM | POA: Diagnosis not present

## 2019-08-05 DIAGNOSIS — Z79891 Long term (current) use of opiate analgesic: Secondary | ICD-10-CM | POA: Diagnosis not present

## 2019-08-05 DIAGNOSIS — R200 Anesthesia of skin: Secondary | ICD-10-CM | POA: Diagnosis not present

## 2019-08-05 DIAGNOSIS — M47816 Spondylosis without myelopathy or radiculopathy, lumbar region: Secondary | ICD-10-CM | POA: Diagnosis not present

## 2019-08-05 DIAGNOSIS — Z880 Allergy status to penicillin: Secondary | ICD-10-CM | POA: Diagnosis not present

## 2019-08-05 DIAGNOSIS — M79604 Pain in right leg: Secondary | ICD-10-CM | POA: Diagnosis not present

## 2019-08-29 DIAGNOSIS — Z23 Encounter for immunization: Secondary | ICD-10-CM | POA: Diagnosis not present

## 2019-10-01 ENCOUNTER — Other Ambulatory Visit: Payer: Self-pay | Admitting: Internal Medicine

## 2020-01-30 DIAGNOSIS — G894 Chronic pain syndrome: Secondary | ICD-10-CM | POA: Diagnosis not present

## 2020-01-30 DIAGNOSIS — M47816 Spondylosis without myelopathy or radiculopathy, lumbar region: Secondary | ICD-10-CM | POA: Diagnosis not present

## 2020-01-30 DIAGNOSIS — M25511 Pain in right shoulder: Secondary | ICD-10-CM | POA: Diagnosis not present

## 2020-01-30 DIAGNOSIS — M545 Low back pain: Secondary | ICD-10-CM | POA: Diagnosis not present

## 2020-01-30 DIAGNOSIS — M25512 Pain in left shoulder: Secondary | ICD-10-CM | POA: Diagnosis not present

## 2020-01-30 DIAGNOSIS — G8929 Other chronic pain: Secondary | ICD-10-CM | POA: Diagnosis not present

## 2020-01-30 DIAGNOSIS — M542 Cervicalgia: Secondary | ICD-10-CM | POA: Diagnosis not present

## 2020-03-27 ENCOUNTER — Other Ambulatory Visit: Payer: Self-pay | Admitting: Internal Medicine

## 2020-04-20 ENCOUNTER — Emergency Department
Admission: EM | Admit: 2020-04-20 | Discharge: 2020-04-20 | Disposition: A | Discharge status: Home / Self Care | Payer: Medicare Other | Attending: Emergency Medicine | Admitting: Emergency Medicine

## 2020-04-20 ENCOUNTER — Emergency Department: Payer: Medicare Other

## 2020-04-20 ENCOUNTER — Other Ambulatory Visit: Payer: Self-pay

## 2020-04-20 DIAGNOSIS — I10 Essential (primary) hypertension: Secondary | ICD-10-CM | POA: Insufficient documentation

## 2020-04-20 DIAGNOSIS — R509 Fever, unspecified: Secondary | ICD-10-CM | POA: Diagnosis not present

## 2020-04-20 DIAGNOSIS — M79652 Pain in left thigh: Secondary | ICD-10-CM | POA: Insufficient documentation

## 2020-04-20 DIAGNOSIS — R109 Unspecified abdominal pain: Secondary | ICD-10-CM | POA: Diagnosis present

## 2020-04-20 DIAGNOSIS — G8929 Other chronic pain: Secondary | ICD-10-CM | POA: Insufficient documentation

## 2020-04-20 DIAGNOSIS — Z886 Allergy status to analgesic agent status: Secondary | ICD-10-CM | POA: Diagnosis not present

## 2020-04-20 DIAGNOSIS — Z880 Allergy status to penicillin: Secondary | ICD-10-CM | POA: Insufficient documentation

## 2020-04-20 DIAGNOSIS — R110 Nausea: Secondary | ICD-10-CM | POA: Insufficient documentation

## 2020-04-20 DIAGNOSIS — Z79899 Other long term (current) drug therapy: Secondary | ICD-10-CM | POA: Diagnosis not present

## 2020-04-20 DIAGNOSIS — M25552 Pain in left hip: Secondary | ICD-10-CM | POA: Insufficient documentation

## 2020-04-20 DIAGNOSIS — K529 Noninfective gastroenteritis and colitis, unspecified: Secondary | ICD-10-CM | POA: Insufficient documentation

## 2020-04-20 DIAGNOSIS — Z7982 Long term (current) use of aspirin: Secondary | ICD-10-CM | POA: Diagnosis not present

## 2020-04-20 DIAGNOSIS — Z882 Allergy status to sulfonamides status: Secondary | ICD-10-CM | POA: Diagnosis not present

## 2020-04-20 DIAGNOSIS — Z791 Long term (current) use of non-steroidal anti-inflammatories (NSAID): Secondary | ICD-10-CM | POA: Insufficient documentation

## 2020-04-20 DIAGNOSIS — M549 Dorsalgia, unspecified: Secondary | ICD-10-CM | POA: Diagnosis not present

## 2020-04-20 DIAGNOSIS — F17210 Nicotine dependence, cigarettes, uncomplicated: Secondary | ICD-10-CM | POA: Insufficient documentation

## 2020-04-20 LAB — URINALYSIS, COMPLETE (UACMP) WITH MICROSCOPIC
Bacteria, UA: NONE SEEN
Bilirubin Urine: NEGATIVE
Glucose, UA: NEGATIVE mg/dL
Ketones, ur: NEGATIVE mg/dL
Leukocytes,Ua: NEGATIVE
Nitrite: NEGATIVE
Protein, ur: NEGATIVE mg/dL
Specific Gravity, Urine: 1.023 (ref 1.005–1.030)
Squamous Epithelial / LPF: NONE SEEN (ref 0–5)
pH: 5 (ref 5.0–8.0)

## 2020-04-20 LAB — CBC
HCT: 43.3 % (ref 39.0–52.0)
Hemoglobin: 15.4 g/dL (ref 13.0–17.0)
MCH: 30.2 pg (ref 26.0–34.0)
MCHC: 35.6 g/dL (ref 30.0–36.0)
MCV: 84.9 fL (ref 80.0–100.0)
Platelets: 272 10*3/uL (ref 150–400)
RBC: 5.1 MIL/uL (ref 4.22–5.81)
RDW: 13.3 % (ref 11.5–15.5)
WBC: 9.9 10*3/uL (ref 4.0–10.5)
nRBC: 0 % (ref 0.0–0.2)

## 2020-04-20 LAB — BASIC METABOLIC PANEL
Anion gap: 12 (ref 5–15)
BUN: 13 mg/dL (ref 6–20)
CO2: 22 mmol/L (ref 22–32)
Calcium: 9 mg/dL (ref 8.9–10.3)
Chloride: 96 mmol/L — ABNORMAL LOW (ref 98–111)
Creatinine, Ser: 0.67 mg/dL (ref 0.61–1.24)
GFR calc Af Amer: >60 mL/min (ref >60)
GFR calc non Af Amer: >60 mL/min (ref >60)
Glucose, Bld: 141 mg/dL — ABNORMAL HIGH (ref 70–99)
Potassium: 3.9 mmol/L (ref 3.5–5.1)
Sodium: 130 mmol/L — ABNORMAL LOW (ref 135–145)

## 2020-04-20 MED ORDER — LACTATED RINGERS IV BOLUS
1000.0000 mL | Freq: Once | INTRAVENOUS | Status: AC
  Administered 2020-04-20: 13:00:00 1000 mL via INTRAVENOUS

## 2020-04-20 MED ORDER — MORPHINE SULFATE (PF) 4 MG/ML IV SOLN
4.0000 mg | Freq: Once | INTRAVENOUS | Status: AC
  Administered 2020-04-20: 13:00:00 4 mg via INTRAVENOUS
  Filled 2020-04-20: qty 1

## 2020-04-20 MED ORDER — ONDANSETRON 4 MG PO TBDP: 4.0000 mg | ORAL_TABLET | Freq: Three times a day (TID) | ORAL | 0 refills | Status: AC | PRN

## 2020-04-20 MED ORDER — IOHEXOL 300 MG/ML  SOLN
100.0000 mL | Freq: Once | INTRAMUSCULAR | Status: AC | PRN
  Administered 2020-04-20: 13:00:00 100 mL via INTRAVENOUS

## 2020-04-20 MED ORDER — ONDANSETRON HCL 4 MG/2ML IJ SOLN
4.0000 mg | Freq: Once | INTRAMUSCULAR | Status: AC
  Administered 2020-04-20: 13:00:00 4 mg via INTRAVENOUS
  Filled 2020-04-20: qty 2

## 2020-04-20 NOTE — ED Notes (Signed)
Pt transported to bedside toilet at this time, pt tolerated well. No further needs noted.

## 2020-04-20 NOTE — ED Notes (Signed)
Pt transported to CT ?

## 2020-04-20 NOTE — ED Triage Notes (Signed)
Pt arrives via POV for reports of left flank pain accompanied with left thigh pain x 3 days. No known injury. Denies dysuria or hematuria. PT A&Ox4, appears uncomfortable in triage. PT reports his urine does have a foul smell. PT states it felt like he had a fever last night but did not take temp.

## 2020-04-20 NOTE — ED Provider Notes (Signed)
[facility] Emergency Department Provider Note   ____________________________________________   First MD Initiated Contact with Patient 04/20/20 1203     (approximate)  I have reviewed the triage vital signs and the nursing notes.   HISTORY  Chief Complaint Flank Pain    HPI Phillip Mullen is a 54 y.o. male with past medical history of hypertension, hyperlipidemia, GERD, and chronic back pain presents to the ED complaining of flank and abdominal pain.  Patient reports that he has had about 3 days of gradually worsening pain starting in his left lower flank and moving into the area of his left lower abdomen, left hip, and left thigh.  Pain is constant and not exacerbated or alleviated by anything.  He reports dealing with back issues in the past, but this pain is very different and does not radiate all the way to his foot like his usual sciatica.  He reports subjective fevers as well as nausea starting last night, also feels like his urine has had a foul odor.  He denies any hematuria, dysuria, diarrhea, or constipation.  He denies any history of kidney stones.        Past Medical History:  Diagnosis Date  . Allergy   . Anxiety   . Bilateral carpal tunnel syndrome 02/10/2013   Per Dr Amanda Pea  . GERD (gastroesophageal reflux disease)   . Headache(784.0)   . Hypertension   . Osteoarthritis   . Osteoporosis   . Seizures (HCC)    1 seisure at age 60 per pt    Patient Active Problem List   Diagnosis Date Noted  . Hyperglycemia 11/11/2018  . Tobacco use disorder 06/12/2017  . Constipation 06/12/2017  . Cellulitis of right upper extremity 06/11/2017  . Right arm cellulitis 06/10/2017  . Sepsis (HCC) 06/10/2017  . Boil 09/14/2016  . Abdominal pain 09/09/2016  . Loss of weight 09/09/2016  . Dysphagia 09/09/2016  . Fatigue 09/09/2016  . Left knee pain 11/17/2015  . Hyperlipidemia 11/16/2015  . Rash and nonspecific skin eruption 12/25/2014  .  Bladder neck obstruction 06/01/2014  . Routine health maintenance 09/09/2013  . Benign prostatic hyperplasia with incomplete bladder emptying 07/29/2013  . Bilateral carpal tunnel syndrome 02/10/2013  . Other malaise and fatigue 02/10/2013  . Shoulder pain, left 04/09/2012  . Nausea with vomiting 02/07/2010  . Chronic back pain 11/21/2008  . ALLERGIC RHINITIS, CHRONIC 02/23/2008  . Anxiety 07/19/2007  . Essential hypertension 07/19/2007  . GERD 07/19/2007  . OSTEOARTHRITIS 07/19/2007  . Headache(784.0) 07/19/2007    Past Surgical History:  Procedure Laterality Date  . ABCESS DRAINAGE     Infected finger  . INGUINAL HERNIA REPAIR    . TONSILLECTOMY      Prior to Admission medications   Medication Sig Start Date End Date Taking? Authorizing Provider  aspirin 81 MG tablet Take 81 mg by mouth daily.      [provider]  cetirizine (ZYRTEC) 10 MG tablet TAKE 1 TABLET BY MOUTH ONCE A DAY 03/27/20   [name], MD  diazepam (VALIUM) 10 MG tablet Take 15 mg by mouth 2 (two) times daily.    [provider]  magnesium hydroxide (MILK OF MAGNESIA) 800 MG/5ML suspension Take 15 mLs by mouth daily as needed for constipation.     [provider]  mometasone (NASONEX) 50 MCG/ACT nasal spray Place 2 sprays daily into the nose. 09/12/17   Nelwyn Salisbury, MD  Multiple Vitamins-Minerals (MULTIVITAMIN GUMMIES ADULT) [place]  1 Dose by mouth daily.    [provider]  naproxen (NAPROSYN) 500 MG tablet Take 1 tablet (500 mg total) by mouth 2 (two) times daily with a meal. 12/21/17   Marrian Salvage, FNP  omeprazole (PRILOSEC) 20 MG capsule Take 1 capsule (20 mg total) by mouth daily. 08/20/18   [name], [name], PA-C  ondansetron (ZOFRAN ODT) 4 MG disintegrating tablet Take 1 tablet (4 mg total) by mouth every 8 (eight) hours as needed for nausea or vomiting. 04/20/20   Blake Divine, MD  oxyCODONE (OXY IR/ROXICODONE) 5 MG immediate release tablet Take 1  tablet (5 mg total) by mouth every 4 (four) hours as needed. Fill on or after 11/10/14 Patient taking differently: Take 5 mg by mouth every 4 (four) hours as needed for breakthrough pain. Fill on or after 11/10/14 10/11/14   Biagio Borg, MD  OxyCODONE (OXYCONTIN) 40 mg T12A 12 hr tablet Take 1 tablet (40 mg total) by mouth 3 (three) times daily. To fill on or after 11/10/2014 10/11/14   Biagio Borg, MD  tamsulosin [facility]) 0.4 MG CAPS capsule TAKE 1 CAPSULE BY MOUTH ONCE DAILY 06/10/19   Biagio Borg, MD    Allergies Meloxicam, Penicillins, and Sulfonamide derivatives  Family History  Problem Relation Age of Onset  . Dementia Mother   . Deep vein thrombosis Mother   . Colon cancer Neg Hx   . Esophageal cancer Neg Hx   . Rectal cancer Neg Hx   . Stomach cancer Neg Hx     Social History Social History   Tobacco Use  . Smoking status: Current Every Day Smoker    Packs/day: 3.00    Types: Cigarettes  . Smokeless tobacco: Never Used  . Tobacco comment: form given 09-29-16  Vaping Use  . Vaping Use: Never used  Substance Use Topics  . Alcohol use: No  . Drug use: No    Review of Systems  Constitutional: Positive for subjective fever/chills Eyes: No visual changes. ENT: No sore throat. Cardiovascular: Denies chest pain. Respiratory: Denies shortness of breath. Gastrointestinal: Positive for flank and abdominal pain.  Positive for nausea, no vomiting.  No diarrhea.  No constipation. Genitourinary: Negative for dysuria. Musculoskeletal: Negative for back pain. Skin: Negative for rash. Neurological: Negative for headaches, focal weakness or numbness.  ____________________________________________   PHYSICAL EXAM:  VITAL SIGNS: ED Triage Vitals  Enc Vitals Group     BP 04/20/20 1120 (!) 158/79     Pulse Rate 04/20/20 1120 68     Resp 04/20/20 1120 16     Temp 04/20/20 1120 98.4 F (36.9 C)     Temp Source 04/20/20 1120 Oral     SpO2 04/20/20 1120 98 %     Weight  04/20/20 1127 150 lb (68 kg)     Height 04/20/20 1127 5\' 6"  (1.676 m)     Head Circumference --      Peak Flow --      Pain Score 04/20/20 1126 10     Pain Loc --      Pain Edu? --      Excl. in [place]? --     Constitutional: Alert and oriented. Eyes: Conjunctivae are normal. Head: Atraumatic. Nose: No congestion/rhinnorhea. Mouth/Throat: Mucous membranes are moist. Neck: Normal ROM Cardiovascular: Normal rate, regular rhythm. Grossly normal heart sounds.  2+ DP pulses bilaterally. Respiratory: Normal respiratory effort.  No retractions. Lungs CTAB. Gastrointestinal: Soft and tender to palpation in the left lower quadrant  with no rebound or guarding.  Left CVA tenderness noted. No distention. Genitourinary: deferred Musculoskeletal: No lower extremity tenderness nor edema. Neurologic:  Normal speech and language. No gross focal neurologic deficits are appreciated. Skin:  Skin is warm, dry and intact. No rash noted. Psychiatric: Mood and affect are normal. Speech and behavior are normal.  ____________________________________________   LABS (all labs ordered are listed, but only abnormal results are displayed)  Labs Reviewed  URINALYSIS, COMPLETE (UACMP) WITH MICROSCOPIC - Abnormal; Notable for the following components:      Result Value   Color, Urine YELLOW (*)    APPearance HAZY (*)    Hgb urine dipstick SMALL (*)    All other components within normal limits  BASIC METABOLIC PANEL - Abnormal; Notable for the following components:   Sodium 130 (*)    Chloride 96 (*)    Glucose, Bld 141 (*)    All other components within normal limits  CBC    PROCEDURES  Procedure(s) performed (including Critical Care):  Procedures   ____________________________________________   INITIAL IMPRESSION / ASSESSMENT AND PLAN / ED COURSE       54 year old male with past medical history of hypertension, hyperlipidemia, and chronic back pain presents to the ED with left flank pain  radiating to his left lower quadrant of his abdomen as well as his left thigh.  He had some subjective fevers and nausea starting last night, also endorses a foul smell to his urine.  Lab work thus far is unremarkable and UA shows no evidence of infection.  No exam findings to suggest cauda equina, he is neurovascularly intact to his bilateral lower extremities.  We will treat symptomatically with morphine, Zofran, and IV fluids, further assess with CT scan.  Differential includes nephrolithiasis, diverticulitis, pyelonephritis, musculoskeletal pain.  CT scan consistent with mild enteritis, no evidence of obstructing nephrolithiasis or diverticulitis.  Patient now endorsing diarrhea over the past couple of days which would be consistent with an enteritis.  Given unremarkable work-up, he is appropriate for discharge home with PCP follow-up.  We will prescribe Zofran and he was also counseled to use Tylenol or ibuprofen as needed for pain.  He was counseled to return to the ED for new or worsening symptoms, patient goes with plan.      ____________________________________________   FINAL CLINICAL IMPRESSION(S) / ED DIAGNOSES  Final diagnoses:  Flank pain  Enteritis     ED Discharge Orders         Ordered    ondansetron (ZOFRAN ODT) 4 MG disintegrating tablet  Every 8 hours PRN     Discontinue  Reprint     04/20/20 1443           Note:  This document was prepared using Dragon voice recognition software and may include unintentional dictation errors.   Chesley Noon, MD 04/20/20 1447

## 2020-05-11 ENCOUNTER — Other Ambulatory Visit: Payer: Self-pay | Admitting: Internal Medicine

## 2020-05-11 NOTE — Telephone Encounter (Signed)
Please refill as per office routine med refill policy (all routine meds refilled for 3 mo or monthly per pt preference up to one year from last visit, then month to month grace period for 3 mo, then further med refills will have to be denied)  

## 2020-06-26 DIAGNOSIS — M47816 Spondylosis without myelopathy or radiculopathy, lumbar region: Secondary | ICD-10-CM | POA: Diagnosis not present

## 2020-06-26 DIAGNOSIS — M25511 Pain in right shoulder: Secondary | ICD-10-CM | POA: Diagnosis not present

## 2020-06-26 DIAGNOSIS — M25512 Pain in left shoulder: Secondary | ICD-10-CM | POA: Diagnosis not present

## 2020-06-26 DIAGNOSIS — G894 Chronic pain syndrome: Secondary | ICD-10-CM | POA: Diagnosis not present

## 2020-06-26 DIAGNOSIS — G8929 Other chronic pain: Secondary | ICD-10-CM | POA: Diagnosis not present

## 2020-06-26 DIAGNOSIS — M545 Low back pain: Secondary | ICD-10-CM | POA: Diagnosis not present

## 2020-06-26 DIAGNOSIS — M542 Cervicalgia: Secondary | ICD-10-CM | POA: Diagnosis not present

## 2020-06-27 DIAGNOSIS — M47816 Spondylosis without myelopathy or radiculopathy, lumbar region: Secondary | ICD-10-CM | POA: Diagnosis not present

## 2020-06-27 DIAGNOSIS — M48061 Spinal stenosis, lumbar region without neurogenic claudication: Secondary | ICD-10-CM | POA: Diagnosis not present

## 2020-07-10 DIAGNOSIS — M47816 Spondylosis without myelopathy or radiculopathy, lumbar region: Secondary | ICD-10-CM | POA: Diagnosis not present

## 2020-07-10 DIAGNOSIS — Z882 Allergy status to sulfonamides status: Secondary | ICD-10-CM | POA: Diagnosis not present

## 2020-07-10 DIAGNOSIS — F410 Panic disorder [episodic paroxysmal anxiety] without agoraphobia: Secondary | ICD-10-CM | POA: Diagnosis not present

## 2020-07-10 DIAGNOSIS — Z880 Allergy status to penicillin: Secondary | ICD-10-CM | POA: Diagnosis not present

## 2020-07-10 DIAGNOSIS — Z7982 Long term (current) use of aspirin: Secondary | ICD-10-CM | POA: Diagnosis not present

## 2020-07-10 DIAGNOSIS — I10 Essential (primary) hypertension: Secondary | ICD-10-CM | POA: Diagnosis not present

## 2020-07-10 DIAGNOSIS — G894 Chronic pain syndrome: Secondary | ICD-10-CM | POA: Diagnosis not present

## 2020-07-10 DIAGNOSIS — Z79899 Other long term (current) drug therapy: Secondary | ICD-10-CM | POA: Diagnosis not present

## 2020-09-24 DIAGNOSIS — G8929 Other chronic pain: Secondary | ICD-10-CM | POA: Diagnosis not present

## 2020-09-24 DIAGNOSIS — G894 Chronic pain syndrome: Secondary | ICD-10-CM | POA: Diagnosis not present

## 2020-09-24 DIAGNOSIS — M25511 Pain in right shoulder: Secondary | ICD-10-CM | POA: Diagnosis not present

## 2020-09-24 DIAGNOSIS — M25512 Pain in left shoulder: Secondary | ICD-10-CM | POA: Diagnosis not present

## 2020-09-24 DIAGNOSIS — R202 Paresthesia of skin: Secondary | ICD-10-CM | POA: Diagnosis not present

## 2020-09-24 DIAGNOSIS — M542 Cervicalgia: Secondary | ICD-10-CM | POA: Diagnosis not present

## 2020-09-24 DIAGNOSIS — M47816 Spondylosis without myelopathy or radiculopathy, lumbar region: Secondary | ICD-10-CM | POA: Diagnosis not present

## 2021-08-29 ENCOUNTER — Other Ambulatory Visit: Payer: Self-pay

## 2021-08-29 ENCOUNTER — Emergency Department
Admission: EM | Admit: 2021-08-29 | Discharge: 2021-08-29 | Disposition: A | Discharge status: Home / Self Care | Payer: Medicare Other | Attending: Emergency Medicine | Admitting: Emergency Medicine

## 2021-08-29 ENCOUNTER — Encounter: Payer: Self-pay | Admitting: *Deleted

## 2021-08-29 DIAGNOSIS — Z79899 Other long term (current) drug therapy: Secondary | ICD-10-CM | POA: Diagnosis not present

## 2021-08-29 DIAGNOSIS — W57XXXA Bitten or stung by nonvenomous insect and other nonvenomous arthropods, initial encounter: Secondary | ICD-10-CM | POA: Diagnosis not present

## 2021-08-29 DIAGNOSIS — Z7982 Long term (current) use of aspirin: Secondary | ICD-10-CM | POA: Insufficient documentation

## 2021-08-29 DIAGNOSIS — F17210 Nicotine dependence, cigarettes, uncomplicated: Secondary | ICD-10-CM | POA: Diagnosis not present

## 2021-08-29 DIAGNOSIS — L03113 Cellulitis of right upper limb: Secondary | ICD-10-CM | POA: Diagnosis not present

## 2021-08-29 DIAGNOSIS — I10 Essential (primary) hypertension: Secondary | ICD-10-CM | POA: Insufficient documentation

## 2021-08-29 DIAGNOSIS — L299 Pruritus, unspecified: Secondary | ICD-10-CM | POA: Diagnosis present

## 2021-08-29 MED ORDER — CEPHALEXIN 500 MG PO CAPS
500.0000 mg | ORAL_CAPSULE | Freq: Once | ORAL | Status: AC
  Administered 2021-08-29: 500 mg via ORAL
  Filled 2021-08-29: qty 1

## 2021-08-29 MED ORDER — CEPHALEXIN 500 MG PO CAPS: 500.0000 mg | ORAL_CAPSULE | Freq: Three times a day (TID) | ORAL | 0 refills | Status: AC

## 2021-08-29 NOTE — Discharge Instructions (Addendum)
Take the antibiotic as directed. Keep the are clean, dry, and covered.

## 2021-08-29 NOTE — ED Triage Notes (Signed)
Pt has possible insect bite to right elbow.  Blister noted on right elbow.  Pt has redness and itching.  No otc meds.  Pt alert speech clear   sx began last night.

## 2021-08-30 NOTE — ED Provider Notes (Signed)
[facility] Emergency Department Provider Note ____________________________________________  Time seen: 2001  I have reviewed the triage vital signs and the nursing notes.  HISTORY  Chief Complaint  Insect Bite  HPI Phillip Mullen is a 55 y.o. male presents to the ED with concern of a possible insect bite to his right elbow.  He presents with an intact blister to the right elbow and some underlying redness.  He notes some some local itching but denies any fevers, chills, sweats, purulent drainage.  He notes some spontaneous drainage last night noting clear thick material, but denies any continued drainage.  Past Medical History:  Diagnosis Date   Allergy    Anxiety    Bilateral carpal tunnel syndrome 02/10/2013   Per Dr Amanda Pea   GERD (gastroesophageal reflux disease)    Headache(784.0)    Hypertension    Osteoarthritis    Osteoporosis    Seizures (HCC)    1 seisure at age 67 per pt    Patient Active Problem List   Diagnosis Date Noted   Hyperglycemia 11/11/2018   Tobacco use disorder 06/12/2017   Constipation 06/12/2017   Cellulitis of right upper extremity 06/11/2017   Right arm cellulitis 06/10/2017   Sepsis (HCC) 06/10/2017   Boil 09/14/2016   Abdominal pain 09/09/2016   Loss of weight 09/09/2016   Dysphagia 09/09/2016   Fatigue 09/09/2016   Left knee pain 11/17/2015   Hyperlipidemia 11/16/2015   Rash and nonspecific skin eruption 12/25/2014   Bladder neck obstruction 06/01/2014   Routine health maintenance 09/09/2013   Benign prostatic hyperplasia with incomplete bladder emptying 07/29/2013   Bilateral carpal tunnel syndrome 02/10/2013   Other malaise and fatigue 02/10/2013   Shoulder pain, left 04/09/2012   Nausea with vomiting 02/07/2010   Chronic back pain 11/21/2008   ALLERGIC RHINITIS, CHRONIC 02/23/2008   Anxiety 07/19/2007   Essential hypertension 07/19/2007   GERD 07/19/2007   OSTEOARTHRITIS 07/19/2007   Headache(784.0)  07/19/2007    Past Surgical History:  Procedure Laterality Date   ABCESS DRAINAGE     Infected finger   INGUINAL HERNIA REPAIR     TONSILLECTOMY      Prior to Admission medications   Medication Sig Start Date End Date Taking? Authorizing Provider  cephALEXin (KEFLEX) 500 MG capsule Take 1 capsule (500 mg total) by mouth 3 (three) times daily for 7 days. 08/29/21 09/05/21 Yes Jayanth Szczesniak, Charlesetta Ivory, PA-C  aspirin 81 MG tablet Take 81 mg by mouth daily.      [provider]  cetirizine (ZYRTEC) 10 MG tablet TAKE 1 TABLET BY MOUTH ONCE A DAY 03/27/20   [name], MD  diazepam (VALIUM) 10 MG tablet Take 15 mg by mouth 2 (two) times daily.    [provider]  magnesium hydroxide (MILK OF MAGNESIA) 800 MG/5ML suspension Take 15 mLs by mouth daily as needed for constipation.     [provider]  mometasone (NASONEX) 50 MCG/ACT nasal spray Place 2 sprays daily into the nose. 09/12/17   Nelwyn Salisbury, MD  Multiple Vitamins-Minerals (MULTIVITAMIN GUMMIES ADULT) CHEW Chew 1 Dose by mouth daily.    [provider]  naproxen (NAPROSYN) 500 MG tablet Take 1 tablet (500 mg total) by mouth 2 (two) times daily with a meal. 12/21/17   Olive Bass, FNP  omeprazole (PRILOSEC) 20 MG capsule Take 1 capsule (20 mg total) by mouth daily. 08/20/18   [name], [name], PA-C  ondansetron (ZOFRAN ODT) 4 MG disintegrating  tablet Take 1 tablet (4 mg total) by mouth every 8 (eight) hours as needed for nausea or vomiting. 04/20/20   Chesley Noon, MD  oxyCODONE (OXY IR/ROXICODONE) 5 MG immediate release tablet Take 1 tablet (5 mg total) by mouth every 4 (four) hours as needed. Fill on or after 11/10/14 Patient taking differently: Take 5 mg by mouth every 4 (four) hours as needed for breakthrough pain. Fill on or after 11/10/14 10/11/14   [name], MD  OxyCODONE (OXYCONTIN) 40 mg T12A 12 hr tablet Take 1 tablet (40 mg total) by mouth 3 (three) times daily. To fill on or  after 11/10/2014 10/11/14   [name], MD  tamsulosin (FLOMAX) 0.4 MG CAPS capsule TAKE 1 CAPSULE BY MOUTH ONCE DAILY 06/10/19   [name], MD    Allergies Meloxicam, Penicillins, and Sulfonamide derivatives  Family History  Problem Relation Age of Onset   Dementia Mother    Deep vein thrombosis Mother    Colon cancer Neg Hx    Esophageal cancer Neg Hx    Rectal cancer Neg Hx    Stomach cancer Neg Hx     Social History Social History   Tobacco Use   Smoking status: Every Day    Packs/day: 3.00    Types: Cigarettes   Smokeless tobacco: Never   Tobacco comments:    form given 09-29-16  Vaping Use   Vaping Use: Never used  Substance Use Topics   Alcohol use: No   Drug use: No    Review of Systems  Constitutional: Negative for fever. Eyes: Negative for visual changes. ENT: Negative for sore throat. Cardiovascular: Negative for chest pain. Respiratory: Negative for shortness of breath. Gastrointestinal: Negative for abdominal pain, vomiting and diarrhea. Genitourinary: Negative for dysuria. Musculoskeletal: Negative for back pain. Skin: Negative for rash.  Right elbow cellulitis as above. Neurological: Negative for headaches, focal weakness or numbness. ____________________________________________  PHYSICAL EXAM:  VITAL SIGNS: ED Triage Vitals [08/29/21 1552]  Enc Vitals Group     BP (!) 182/82     Pulse Rate 88     Resp 18     Temp 98.4 F (36.9 C)     Temp Source Oral     SpO2 97 %     Weight 159 lb (72.1 kg)     Height 5\' 6"  (1.676 m)     Head Circumference      Peak Flow      Pain Score 5     Pain Loc      Pain Edu?      Excl. in [place]?     Constitutional: Alert and oriented. Well appearing and in no distress. Head: Normocephalic and atraumatic. Eyes: Conjunctivae are normal. Normal extraocular movements Cardiovascular: Normal rate, regular rhythm. Normal distal pulses. Respiratory: Normal respiratory effort. No  wheezes/rales/rhonchi. Gastrointestinal: Soft and nontender. No distention. Musculoskeletal: Nontender with normal range of motion in all extremities.  Neurologic:  Normal gait without ataxia. Normal speech and language. No gross focal neurologic deficits are appreciated. Skin:  Skin is warm, dry and intact. No rash noted.  Right elbow with a intact superficial blister to the olecranon process with clear fluid involved.  There is underlying blister bed appears erythematous.  No surrounding induration, warmth, pointing, fluctuance is noted. ____________________________________________    {LABS (pertinent positives/negatives)  ____________________________________________  {EKG  ____________________________________________   RADIOLOGY Official radiology report(s): No results found. ____________________________________________  PROCEDURES  Keflex 500 mg p.o.  Procedures ____________________________________________  INITIAL IMPRESSION / ASSESSMENT AND PLAN / ED COURSE  As part of my medical decision making, I reviewed the following data within the electronic MEDICAL RECORD NUMBER Notes from prior ED visits   Patient with ED evaluation of a an intact blister to the right elbow concerning for a possible insect bite reaction.  The blister is cleansed and lanced with 18-gauge needle clear thick serous fluid is expressed.  The blister Remains in place and dressing is applied.  Patient started empirically on Keflex.  He will follow with primary provider return to ED if needed.  Bacilio Abascal Oboyle was evaluated in Emergency Department on 08/30/2021 for the symptoms described in the history of present illness. He was evaluated in the context of the global COVID-19 pandemic, which necessitated consideration that the patient might be at risk for infection with the SARS-CoV-2 virus that causes COVID-19. Institutional protocols and algorithms that pertain to the evaluation of patients at risk for  COVID-19 are in a state of rapid change based on information released by regulatory bodies including the CDC and federal and state organizations. These policies and algorithms were followed during the patient's care in the ED. ____________________________________________  FINAL CLINICAL IMPRESSION(S) / ED DIAGNOSES  Final diagnoses:  Cellulitis of right upper extremity      Karmen Stabs, Charlesetta Ivory, PA-C 08/30/21 2256    Arnaldo Natal, MD 08/30/21 2313

## 2022-10-18 ENCOUNTER — Other Ambulatory Visit: Payer: Self-pay | Admitting: Internal Medicine
